# Patient Record
Sex: Female | Born: 1950 | Race: White | Hispanic: No | Smoking: Former smoker
Health system: Southern US, Community
[De-identification: ages and names within clinical notes are randomized; demographics above are authoritative.]

## PROBLEM LIST (undated history)

## (undated) DIAGNOSIS — Z923 Personal history of irradiation: Secondary | ICD-10-CM

## (undated) DIAGNOSIS — C801 Malignant (primary) neoplasm, unspecified: Secondary | ICD-10-CM

## (undated) DIAGNOSIS — M549 Dorsalgia, unspecified: Secondary | ICD-10-CM

## (undated) DIAGNOSIS — M419 Scoliosis, unspecified: Secondary | ICD-10-CM

## (undated) DIAGNOSIS — I10 Essential (primary) hypertension: Secondary | ICD-10-CM

## (undated) DIAGNOSIS — R0981 Nasal congestion: Secondary | ICD-10-CM

## (undated) HISTORY — PX: BACK SURGERY: SHX140

## (undated) HISTORY — DX: Essential (primary) hypertension: I10

## (undated) HISTORY — DX: Dorsalgia, unspecified: M54.9

## (undated) HISTORY — DX: Scoliosis, unspecified: M41.9

## (undated) HISTORY — DX: Nasal congestion: R09.81

## (undated) HISTORY — PX: ABDOMINAL HYSTERECTOMY: SHX81

---

## 2021-02-16 ENCOUNTER — Other Ambulatory Visit: Payer: Self-pay | Admitting: Neurosurgery

## 2021-02-16 DIAGNOSIS — R918 Other nonspecific abnormal finding of lung field: Secondary | ICD-10-CM

## 2021-03-05 ENCOUNTER — Ambulatory Visit
Admission: RE | Admit: 2021-03-05 | Discharge: 2021-03-05 | Disposition: A | Discharge status: Home / Self Care | Payer: Medicare Other | Source: Ambulatory Visit | Attending: Neurosurgery | Admitting: Neurosurgery

## 2021-03-05 ENCOUNTER — Other Ambulatory Visit: Payer: Self-pay

## 2021-03-05 DIAGNOSIS — R918 Other nonspecific abnormal finding of lung field: Secondary | ICD-10-CM

## 2021-03-05 MED ORDER — IOPAMIDOL (ISOVUE-370) INJECTION 76%
80.0000 mL | Freq: Once | INTRAVENOUS | Status: AC | PRN
  Administered 2021-03-05: 80 mL via INTRAVENOUS

## 2021-04-02 ENCOUNTER — Other Ambulatory Visit: Payer: Self-pay

## 2021-04-02 ENCOUNTER — Ambulatory Visit (INDEPENDENT_AMBULATORY_CARE_PROVIDER_SITE_OTHER): Payer: Medicare Other | Admitting: Pulmonary Disease

## 2021-04-02 ENCOUNTER — Encounter: Payer: Self-pay | Admitting: Pulmonary Disease

## 2021-04-02 VITALS — BP 126/80 | HR 68 | Temp 98.4°F | Ht 69.0 in | Wt 97.0 lb

## 2021-04-02 DIAGNOSIS — R911 Solitary pulmonary nodule: Secondary | ICD-10-CM

## 2021-04-02 DIAGNOSIS — Z87891 Personal history of nicotine dependence: Secondary | ICD-10-CM

## 2021-04-02 DIAGNOSIS — R918 Other nonspecific abnormal finding of lung field: Secondary | ICD-10-CM | POA: Diagnosis not present

## 2021-04-02 DIAGNOSIS — T17500A Unspecified foreign body in bronchus causing asphyxiation, initial encounter: Secondary | ICD-10-CM | POA: Diagnosis not present

## 2021-04-02 NOTE — Progress Notes (Signed)
Synopsis: Referred in November 2022 for lung nodule by Robin Levine, MD  Subjective:   PATIENT ID: Robin Bryant GENDER: female DOB: 05-24-1950, MRN: [id number]  Chief Complaint  Patient presents with   Consult    Patient wants to talk about the scan she had done    This is a 70 year old female, past medical history hypertension, scoliosis, referred for evaluation of pulmonary nodules.  Patient had a CT of the thoracic spine that was completed by Dr. Vertell Limber from neurosurgery.  Imaging revealed mild bronchiectasis with areas of mucoid impaction within the lung and predominantly within the right middle lobe suggestive of NTM or MAC.  Also found to have bilateral pulmonary nodules within the lung.  Largest within the medial portion of the right lower lobe at 1.8 x 2.3 cm in size.  Patient was referred for evaluation.  Imaging complete and reviewed today in the office.   Past Medical History:  Diagnosis Date   Back pain    HTN (hypertension)    Scoliosis    Sinus congestion      Family History  Problem Relation Age of Onset   Alzheimer's disease Mother    Cancer Father    Lung cancer Father      Past Surgical History:  Procedure Laterality Date   ABDOMINAL HYSTERECTOMY     BACK SURGERY      Social History   Socioeconomic History   Marital status: Single    Spouse name: Not on file   Number of children: Not on file   Years of education: Not on file   Highest education level: Not on file  Occupational History   Not on file  Tobacco Use   Smoking status: Every Day    Packs/day: 0.50    Years: 50.00    Pack years: 25.00    Types: Cigarettes   Smokeless tobacco: Never  Substance and Sexual Activity   Alcohol use: Never   Drug use: Never   Sexual activity: Not on file  Other Topics Concern   Not on file  Social History Narrative   Not on file   Social Determinants of Health   Financial Resource Strain: Not on file  Food Insecurity: Not on file   Transportation Needs: Not on file  Physical Activity: Not on file  Stress: Not on file  Social Connections: Not on file  Intimate Partner Violence: Not on file     Not on File   Outpatient Medications Prior to Visit  Medication Sig Dispense Refill   EUTHYROX 50 MCG tablet Take 50 mcg by mouth every morning.     gabapentin (NEURONTIN) 800 MG tablet Take 800 mg by mouth 4 (four) times daily.     HYDROcodone-acetaminophen (NORCO) 10-325 MG tablet Take 1 tablet by mouth 4 (four) times daily as needed.     metoprolol succinate (TOPROL-XL) 25 MG 24 hr tablet Take 25 mg by mouth daily.     No facility-administered medications prior to visit.    Review of Systems  Constitutional:  Negative for chills, fever, malaise/fatigue and weight loss.  HENT:  Negative for hearing loss, sore throat and tinnitus.   Eyes:  Negative for blurred vision and double vision.  Respiratory:  Negative for cough, hemoptysis, sputum production, shortness of breath, wheezing and stridor.   Cardiovascular:  Negative for chest pain, palpitations, orthopnea, leg swelling and PND.  Gastrointestinal:  Negative for abdominal pain, constipation, diarrhea, heartburn, nausea and vomiting.  Genitourinary:  Negative for dysuria, hematuria  and urgency.  Musculoskeletal:  Negative for joint pain and myalgias.  Skin:  Negative for itching and rash.  Neurological:  Negative for dizziness, tingling, weakness and headaches.  Endo/Heme/Allergies:  Negative for environmental allergies. Does not bruise/bleed easily.  Psychiatric/Behavioral:  Negative for depression. The patient is not nervous/anxious and does not have insomnia.   All other systems reviewed and are negative.   Objective:  Physical Exam Vitals reviewed.  Constitutional:      General: She is not in acute distress.    Appearance: She is well-developed.  HENT:     Head: Normocephalic and atraumatic.  Eyes:     General: No scleral icterus.    Conjunctiva/sclera:  Conjunctivae normal.     Pupils: Pupils are equal, round, and reactive to light.  Neck:     Vascular: No JVD.     Trachea: No tracheal deviation.  Cardiovascular:     Rate and Rhythm: Normal rate and regular rhythm.     Heart sounds: Normal heart sounds. No murmur heard. Pulmonary:     Effort: Pulmonary effort is normal. No tachypnea, accessory muscle usage or respiratory distress.     Breath sounds: Normal breath sounds. No stridor. No wheezing, rhonchi or rales.  Abdominal:     General: There is no distension.     Palpations: Abdomen is soft.     Tenderness: There is no abdominal tenderness.  Musculoskeletal:        General: No tenderness.     Cervical back: Neck supple.  Lymphadenopathy:     Cervical: No cervical adenopathy.  Skin:    General: Skin is warm and dry.     Capillary Refill: Capillary refill takes less than 2 seconds.     Findings: No rash.  Neurological:     Mental Status: She is alert and oriented to person, place, and time.  Psychiatric:        Behavior: Behavior normal.     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  There is bilateral pulmonary nodules.  Largest within the right lower lobe at 1.8 x 2.3 cm in size. The patient's images have been independently reviewed by me.    Pulmonary Functions Testing Results: No flowsheet data found.  FeNO:   Pathology:   Echocardiogram:   Heart Catheterization:     Assessment & Plan:     ICD-10-CM   1.  Right lower lobe pulmonary nodule  R91.1 NM PET Image Initial (PI) Skull Base To Thigh (F-18 FDG)    2. Mucoid impaction of bronchi  T17.500A     3. Multiple pulmonary nodules  R91.8       Discussion:  This is a 70 year old female with incidentally found multiple pulmonary nodules.  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  Additionally has right lower lobe predominant nodule at 1.8 x 2.3 cm in size.  Patient  was referred for evaluation of these nodules  Plan: Before I would consider biopsy of this I think a PET scan would be Bryant appropriate. We will get the PET scan first if there is significant hypermetabolic uptake concerning for malignancy then I think we could proceed with bronchoscopy. She does have some areas of bronchiectasis which makes this lesion against the potential for being inflammatory. However with her smoking history as well as her family history of lung cancer I think it would be prudent for [place] to evaluate this closely.  If the lesion is hypermetabolic we can also discussed the potential for surgical resection of the lesion.    We will get her to see me or Eric Form, NP in a few weeks after PET scan to get bronchoscopy set up.    Current Outpatient Medications:    EUTHYROX 50 MCG tablet, Take 50 mcg by mouth every morning., Disp: , Rfl:    gabapentin (NEURONTIN) 800 MG tablet, Take 800 mg by mouth 4 (four) times daily., Disp: , Rfl:    HYDROcodone-acetaminophen (NORCO) 10-325 MG tablet, Take 1 tablet by mouth 4 (four) times daily as needed., Disp: , Rfl:    metoprolol succinate (TOPROL-XL) 25 MG 24 hr tablet, Take 25 mg by mouth daily., Disp: , Rfl:   I spent 63 minutes dedicated to the care of this patient on the date of this encounter to include pre-visit review of records, face-to-face time with the patient discussing conditions above, post visit ordering of testing, clinical documentation with the  electronic health record, making appropriate referrals as documented, and communicating necessary findings to members of the patients care team.   Garner Nash, DO [facility] 04/02/2021 4:13 PM

## 2021-04-02 NOTE — Patient Instructions (Signed)
Thank you for visiting Dr. Valeta Harms at [facility]. Today we recommend the following:  Orders Placed This Encounter  Procedures   NM PET Image Initial (PI) Skull Base To Thigh (F-18 FDG)   PET SCAN First  We will discuss results and if positive get you set up for bronchoscopy   Return in about 3 weeks (around 04/23/2021) for with Eric Form, NP, or Dr. Valeta Harms.    Please do your part to reduce the spread of COVID-19.

## 2021-04-05 ENCOUNTER — Other Ambulatory Visit (HOSPITAL_COMMUNITY): Payer: Medicare Other

## 2021-04-16 ENCOUNTER — Other Ambulatory Visit: Payer: Self-pay

## 2021-04-16 ENCOUNTER — Ambulatory Visit (HOSPITAL_COMMUNITY)
Admission: RE | Admit: 2021-04-16 | Discharge: 2021-04-16 | Disposition: A | Discharge status: Home / Self Care | Payer: Medicare Other | Source: Ambulatory Visit | Attending: Pulmonary Disease | Admitting: Pulmonary Disease

## 2021-04-16 DIAGNOSIS — J439 Emphysema, unspecified: Secondary | ICD-10-CM | POA: Insufficient documentation

## 2021-04-16 DIAGNOSIS — I2510 Atherosclerotic heart disease of native coronary artery without angina pectoris: Secondary | ICD-10-CM | POA: Insufficient documentation

## 2021-04-16 DIAGNOSIS — R911 Solitary pulmonary nodule: Secondary | ICD-10-CM | POA: Insufficient documentation

## 2021-04-16 DIAGNOSIS — I700 Atherosclerosis of aorta: Secondary | ICD-10-CM | POA: Insufficient documentation

## 2021-04-16 LAB — GLUCOSE, CAPILLARY: Glucose-Capillary: 107 mg/dL — ABNORMAL HIGH (ref 70–99)

## 2021-04-16 MED ORDER — FLUDEOXYGLUCOSE F - 18 (FDG) INJECTION
5.0000 | Freq: Once | INTRAVENOUS | Status: AC | PRN
  Administered 2021-04-16: 12:00:00 5 via INTRAVENOUS

## 2021-04-23 ENCOUNTER — Encounter: Payer: Self-pay | Admitting: Pulmonary Disease

## 2021-04-23 ENCOUNTER — Ambulatory Visit (INDEPENDENT_AMBULATORY_CARE_PROVIDER_SITE_OTHER): Payer: Medicare Other | Admitting: Pulmonary Disease

## 2021-04-23 ENCOUNTER — Other Ambulatory Visit: Payer: Self-pay

## 2021-04-23 VITALS — BP 136/100 | HR 81 | Temp 98.2°F | Ht 69.0 in | Wt 98.4 lb

## 2021-04-23 DIAGNOSIS — Z87891 Personal history of nicotine dependence: Secondary | ICD-10-CM | POA: Diagnosis not present

## 2021-04-23 DIAGNOSIS — R911 Solitary pulmonary nodule: Secondary | ICD-10-CM | POA: Diagnosis not present

## 2021-04-23 DIAGNOSIS — R942 Abnormal results of pulmonary function studies: Secondary | ICD-10-CM

## 2021-04-23 DIAGNOSIS — R918 Other nonspecific abnormal finding of lung field: Secondary | ICD-10-CM

## 2021-04-23 NOTE — Progress Notes (Signed)
Synopsis: Referred in November 2022 for lung nodule by No ref. provider found  Subjective:   PATIENT ID: Robin Bryant GENDER: female DOB: 1951-05-18, MRN: [id number]  Chief Complaint  Patient presents with   Follow-up    Patient is here to talk about results from PET scan    This is a 70 year old female, past medical history hypertension, scoliosis, referred for evaluation of pulmonary nodules.  Patient had a CT of the thoracic spine that was completed by Dr. Vertell Limber from neurosurgery.  Imaging revealed mild bronchiectasis with areas of mucoid impaction within the lung and predominantly within the right middle lobe suggestive of NTM or MAC.  Also found to have bilateral pulmonary nodules within the lung.  Largest within the medial portion of the right lower lobe at 1.8 x 2.3 cm in size.  Patient was referred for evaluation.  Imaging complete and reviewed today in the office.  OV 04/23/2021: Patient here today for follow-up after nuclear medicine pet imaging.Patient had nuclear medicine PET imaging on 04/16/2021.  This revealed the same right lower lobe nodule with measurement of 1.8 x 1.6 cm SUV max of 9.  Additionally the left upper lobe nodules scattered throughout the lung have hypermetabolic uptake.  Concern for either infectious inflammatory disease within the right upper lobe and potentially a primary bronchogenic carcinoma within the right lower lobe.  I reviewed the images today with the patient in the office as well as her friend present.  We discussed all of next possible steps.   Past Medical History:  Diagnosis Date   Back pain    HTN (hypertension)    Scoliosis    Sinus congestion      Family History  Problem Relation Age of Onset   Alzheimer's disease Mother    Cancer Father    Lung cancer Father      Past Surgical History:  Procedure Laterality Date   ABDOMINAL HYSTERECTOMY     BACK SURGERY      Social History   Socioeconomic History   Marital status: Divorced     Spouse name: Not on file   Number of children: Not on file   Years of education: Not on file   Highest education level: Not on file  Occupational History   Not on file  Tobacco Use   Smoking status: Every Day    Packs/day: 0.50    Years: 50.00    Pack years: 25.00    Types: Cigarettes   Smokeless tobacco: Never  Substance and Sexual Activity   Alcohol use: Never   Drug use: Never   Sexual activity: Not on file  Other Topics Concern   Not on file  Social History Narrative   Not on file   Social Determinants of Health   Financial Resource Strain: Not on file  Food Insecurity: Not on file  Transportation Needs: Not on file  Physical Activity: Not on file  Stress: Not on file  Social Connections: Not on file  Intimate Partner Violence: Not on file     Not on File   Outpatient Medications Prior to Visit  Medication Sig Dispense Refill   EUTHYROX 50 MCG tablet Take 50 mcg by mouth every morning.     gabapentin (NEURONTIN) 800 MG tablet Take 800 mg by mouth 4 (four) times daily.     HYDROcodone-acetaminophen (NORCO) 10-325 MG tablet Take 1 tablet by mouth 4 (four) times daily as needed.     metoprolol succinate (TOPROL-XL) 25 MG 24 hr tablet  Take 25 mg by mouth daily.     No facility-administered medications prior to visit.    Review of Systems  Constitutional:  Negative for chills, fever, malaise/fatigue and weight loss.  HENT:  Negative for hearing loss, sore throat and tinnitus.   Eyes:  Negative for blurred vision and double vision.  Respiratory:  Negative for cough, hemoptysis, sputum production, shortness of breath, wheezing and stridor.   Cardiovascular:  Negative for chest pain, palpitations, orthopnea, leg swelling and PND.  Gastrointestinal:  Negative for abdominal pain, constipation, diarrhea, heartburn, nausea and vomiting.  Genitourinary:  Negative for dysuria, hematuria and urgency.  Musculoskeletal:  Negative for joint pain and myalgias.  Skin:   Negative for itching and rash.  Neurological:  Negative for dizziness, tingling, weakness and headaches.  Endo/Heme/Allergies:  Negative for environmental allergies. Does not bruise/bleed easily.  Psychiatric/Behavioral:  Negative for depression. The patient is not nervous/anxious and does not have insomnia.   All other systems reviewed and are negative.   Objective:  Physical Exam Vitals reviewed.  Constitutional:      General: She is not in acute distress.    Appearance: She is well-developed.     Comments: Thin frail elderly lady.  HENT:     Head: Normocephalic and atraumatic.  Eyes:     General: No scleral icterus.    Conjunctiva/sclera: Conjunctivae normal.     Pupils: Pupils are equal, round, and reactive to light.  Neck:     Vascular: No JVD.     Trachea: No tracheal deviation.  Cardiovascular:     Rate and Rhythm: Normal rate and regular rhythm.     Heart sounds: Normal heart sounds. No murmur heard. Pulmonary:     Effort: Pulmonary effort is normal. No tachypnea, accessory muscle usage or respiratory distress.     Breath sounds: No stridor. No wheezing, rhonchi or rales.  Abdominal:     General: There is no distension.     Palpations: Abdomen is soft.     Tenderness: There is no abdominal tenderness.  Musculoskeletal:        General: Deformity present. No tenderness.     Cervical back: Neck supple.     Comments: Kyphosis  Lymphadenopathy:     Cervical: No cervical adenopathy.  Skin:    General: Skin is warm and dry.     Capillary Refill: Capillary refill takes less than 2 seconds.     Findings: No rash.  Neurological:     Mental Status: She is alert and oriented to person, place, and time.  Psychiatric:        Behavior: Behavior normal.     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  There is bilateral pulmonary nodules.  Largest within the right lower lobe at 1.8 x 2.3 cm in size. The patient's images have  been independently reviewed by me.    04/16/2021 nuclear medicine pet imaging: 1.8 cm right lower lobe pleural-based pulmonary nodule hypermetabolic with SUV of 9 concerning for primary bronchogenic carcinoma. The patient's images have been independently reviewed by me.    Pulmonary Functions Testing Results: No flowsheet data found.  FeNO:   Pathology:   Echocardiogram:   Heart Catheterization:     Assessment & Plan:     ICD-10-CM   1. Multiple lung nodules  R91.8 Ambulatory referral to Pulmonology    Procedural/ Surgical Case Request: ROBOTIC ASSISTED NAVIGATIONAL BRONCHOSCOPY    CT Super D Chest Wo Contrast    2. Right lower lobe pulmonary nodule  R91.1     3. Multiple pulmonary nodules  R91.8     4. Former smoker  Z87.891     5. Abnormal PET of right lung  R94.2       Discussion:  This is a 70 year old female, incidentally found multiple pulmonary nodules, the largest being 1.8 cm within the right lower lobe hypermetabolic in nature on pet imaging concerning for primary bronchogenic carcinoma.  She also has disease within the right middle lobe and left upper lobe associated bronchiectasis and small nodules potential for an atypical infection such as NTM.  Plan: Due to the patient's abnormalities we discussed all of the possible neck steps and reviewed images today in the office in detail.  Patient is agreeable to proceed with robotic assisted  navigational bronchoscopy with tissue sampling. We will also plan for cultures of the tissue due to the potential for NTM. Patient is agreeable to proceed with this. We have scheduled a tentative date for 05/15/2021. Orders have been placed. We appreciate PCC's help with scheduling.    Current Outpatient Medications:    EUTHYROX 50 MCG tablet, Take 50 mcg by mouth every morning., Disp: , Rfl:    gabapentin (NEURONTIN) 800 MG tablet, Take 800 mg by mouth 4 (four) times daily., Disp: , Rfl:    HYDROcodone-acetaminophen (NORCO) 10-325 MG tablet, Take 1 tablet by mouth 4 (four) times daily as needed., Disp: , Rfl:    metoprolol succinate (TOPROL-XL) 25 MG 24 hr tablet, Take 25 mg by mouth daily., Disp: , Rfl:     Garner Nash, DO [facility] 04/23/2021 11:11 AM

## 2021-04-23 NOTE — Patient Instructions (Addendum)
Thank you for visiting Dr. Valeta Harms at [facility]. Today we recommend the following:  Orders Placed This Encounter  Procedures   Procedural/ Surgical Case Request: ROBOTIC ASSISTED NAVIGATIONAL BRONCHOSCOPY   CT Super D Chest Wo Contrast   Ambulatory referral to Pulmonology   Tentative bronchoscopy date 05/15/2021  Return in about 4 weeks (around 05/21/2021) for with Eric Form, NP, or Dr. Valeta Harms. To review bronchoscopy results.     Please do your part to reduce the spread of COVID-19.

## 2021-04-23 NOTE — H&P (View-Only) (Signed)
Synopsis: Referred in November 2022 for lung nodule by No ref. provider found  Subjective:   PATIENT ID: Robin Bryant GENDER: female DOB: [birth year], MRN: [id number]  Chief Complaint  Patient presents with   Follow-up    Patient is here to talk about results from PET scan    This is a 70 year old female, past medical history hypertension, scoliosis, referred for evaluation of pulmonary nodules.  Patient had a CT of the thoracic spine that was completed by Dr. Vertell Limber from neurosurgery.  Imaging revealed mild bronchiectasis with areas of mucoid impaction within the lung and predominantly within the right middle lobe suggestive of NTM or MAC.  Also found to have bilateral pulmonary nodules within the lung.  Largest within the medial portion of the right lower lobe at 1.8 x 2.3 cm in size.  Patient was referred for evaluation.  Imaging complete and reviewed today in the office.  OV 04/23/2021: Patient here today for follow-up after nuclear medicine pet imaging.Patient had nuclear medicine PET imaging on 04/16/2021.  This revealed the same right lower lobe nodule with measurement of 1.8 x 1.6 cm SUV max of 9.  Additionally the left upper lobe nodules scattered throughout the lung have hypermetabolic uptake.  Concern for either infectious inflammatory disease within the right upper lobe and potentially a primary bronchogenic carcinoma within the right lower lobe.  I reviewed the images today with the patient in the office as well as her friend present.  We discussed all of next possible steps.   Past Medical History:  Diagnosis Date   Back pain    HTN (hypertension)    Scoliosis    Sinus congestion      Family History  Problem Relation Age of Onset   Alzheimer's disease Mother    Cancer Father    Lung cancer Father      Past Surgical History:  Procedure Laterality Date   ABDOMINAL HYSTERECTOMY     BACK SURGERY      Social History   Socioeconomic History   Marital status: Divorced     Spouse name: Not on file   Number of children: Not on file   Years of education: Not on file   Highest education level: Not on file  Occupational History   Not on file  Tobacco Use   Smoking status: Every Day    Packs/day: 0.50    Years: 50.00    Pack years: 25.00    Types: Cigarettes   Smokeless tobacco: Never  Substance and Sexual Activity   Alcohol use: Never   Drug use: Never   Sexual activity: Not on file  Other Topics Concern   Not on file  Social History Narrative   Not on file   Social Determinants of Health   Financial Resource Strain: Not on file  Food Insecurity: Not on file  Transportation Needs: Not on file  Physical Activity: Not on file  Stress: Not on file  Social Connections: Not on file  Intimate Partner Violence: Not on file     Not on File   Outpatient Medications Prior to Visit  Medication Sig Dispense Refill   EUTHYROX 50 MCG tablet Take 50 mcg by mouth every morning.     gabapentin (NEURONTIN) 800 MG tablet Take 800 mg by mouth 4 (four) times daily.     HYDROcodone-acetaminophen (NORCO) 10-325 MG tablet Take 1 tablet by mouth 4 (four) times daily as needed.     metoprolol succinate (TOPROL-XL) 25 MG 24 hr tablet  Take 25 mg by mouth daily.     No facility-administered medications prior to visit.    Review of Systems  Constitutional:  Negative for chills, fever, malaise/fatigue and weight loss.  HENT:  Negative for hearing loss, sore throat and tinnitus.   Eyes:  Negative for blurred vision and double vision.  Respiratory:  Negative for cough, hemoptysis, sputum production, shortness of breath, wheezing and stridor.   Cardiovascular:  Negative for chest pain, palpitations, orthopnea, leg swelling and PND.  Gastrointestinal:  Negative for abdominal pain, constipation, diarrhea, heartburn, nausea and vomiting.  Genitourinary:  Negative for dysuria, hematuria and urgency.  Musculoskeletal:  Negative for joint pain and myalgias.  Skin:   Negative for itching and rash.  Neurological:  Negative for dizziness, tingling, weakness and headaches.  Endo/Heme/Allergies:  Negative for environmental allergies. Does not bruise/bleed easily.  Psychiatric/Behavioral:  Negative for depression. The patient is not nervous/anxious and does not have insomnia.   All other systems reviewed and are negative.   Objective:  Physical Exam Vitals reviewed.  Constitutional:      General: She is not in acute distress.    Appearance: She is well-developed.     Comments: Thin frail elderly lady.  HENT:     Head: Normocephalic and atraumatic.  Eyes:     General: No scleral icterus.    Conjunctiva/sclera: Conjunctivae normal.     Pupils: Pupils are equal, round, and reactive to light.  Neck:     Vascular: No JVD.     Trachea: No tracheal deviation.  Cardiovascular:     Rate and Rhythm: Normal rate and regular rhythm.     Heart sounds: Normal heart sounds. No murmur heard. Pulmonary:     Effort: Pulmonary effort is normal. No tachypnea, accessory muscle usage or respiratory distress.     Breath sounds: No stridor. No wheezing, rhonchi or rales.  Abdominal:     General: There is no distension.     Palpations: Abdomen is soft.     Tenderness: There is no abdominal tenderness.  Musculoskeletal:        General: Deformity present. No tenderness.     Cervical back: Neck supple.     Comments: Kyphosis  Lymphadenopathy:     Cervical: No cervical adenopathy.  Skin:    General: Skin is warm and dry.     Capillary Refill: Capillary refill takes less than 2 seconds.     Findings: No rash.  Neurological:     Mental Status: She is alert and oriented to person, place, and time.  Psychiatric:        Behavior: Behavior normal.     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  There is bilateral pulmonary nodules.  Largest within the right lower lobe at 1.8 x 2.3 cm in size. The patient's images have   been independently reviewed by me.    04/16/2021 nuclear medicine pet imaging: 1.8 cm right lower lobe pleural-based pulmonary nodule hypermetabolic with SUV of 9 concerning for primary bronchogenic carcinoma. The patient's images have been independently reviewed by me.    Pulmonary Functions Testing Results: No flowsheet data found.  FeNO:   Pathology:   Echocardiogram:   Heart Catheterization:     Assessment & Plan:     ICD-10-CM   1. Multiple lung nodules  R91.8 Ambulatory referral to Pulmonology    Procedural/ Surgical Case Request: ROBOTIC ASSISTED NAVIGATIONAL BRONCHOSCOPY    CT Super D Chest Wo Contrast    2. Right lower lobe pulmonary nodule  R91.1     3. Multiple pulmonary nodules  R91.8     4. Former smoker  Z87.891     5. Abnormal PET of right lung  R94.2       Discussion:  This is a 70 year old female, incidentally found multiple pulmonary nodules, the largest being 1.8 cm within the right lower lobe hypermetabolic in nature on pet imaging concerning for primary bronchogenic carcinoma.  She also has disease within the right middle lobe and left upper lobe associated bronchiectasis and small nodules potential for an atypical infection such as NTM.  Plan: Due to the patient's abnormalities we discussed all of the possible neck steps and reviewed images today in the office in detail.  Patient is agreeable to proceed with robotic assisted  navigational bronchoscopy with tissue sampling. We will also plan for cultures of the tissue due to the potential for NTM. Patient is agreeable to proceed with this. We have scheduled a tentative date for 05/15/2021. Orders have been placed. We appreciate PCC's help with scheduling.    Current Outpatient Medications:    EUTHYROX 50 MCG tablet, Take 50 mcg by mouth every morning., Disp: , Rfl:    gabapentin (NEURONTIN) 800 MG tablet, Take 800 mg by mouth 4 (four) times daily., Disp: , Rfl:    HYDROcodone-acetaminophen (NORCO) 10-325 MG tablet, Take 1 tablet by mouth 4 (four) times daily as needed., Disp: , Rfl:    metoprolol succinate (TOPROL-XL) 25 MG 24 hr tablet, Take 25 mg by mouth daily., Disp: , Rfl:     Garner Nash, DO [facility] 04/23/2021 11:11 AM

## 2021-05-10 ENCOUNTER — Other Ambulatory Visit: Payer: Self-pay

## 2021-05-10 ENCOUNTER — Encounter (HOSPITAL_COMMUNITY): Payer: Self-pay | Admitting: Pulmonary Disease

## 2021-05-10 NOTE — Progress Notes (Signed)
PCP - denies Cardiologist - denies EKG - DOS Chest x-ray -  ECHO -  Cardiac Cath -  CPAP -   Aspirin Instructions: Follow your surgeon's instructions on when to stop Aspirin.  If no instructions were given by your surgeon then you will need to call the office to get those instructions.    ERAS Protcol -  COVID TEST- Friday 12/23  Anesthesia review: n/a  -------------  SDW INSTRUCTIONS:  Your procedure is scheduled on Tuesday 12/27. Please report to [facility] Main Entrance "A" at 0700 A.M., and check in at the Admitting office. Call this number if you have problems the morning of surgery: [phone]   Remember: Do not eat or drink after midnight the night before your surgery   Medications to take morning of surgery with a sip of water include: EUTHYROX  gabapentin (NEURONTIN) metoprolol succinate (TOPROL-XL)  HYDROcodone-acetaminophen (NORCO)  As of today, STOP taking any Aspirin (unless otherwise instructed by your surgeon), Aleve, Naproxen, Ibuprofen, Motrin, Advil, Goody's, BC's, all herbal medications, fish oil, and all vitamins.    The Morning of Surgery Do not wear jewelry, make-up or nail polish. Do not wear lotions, powders, or perfumes, or deodorant Do not bring valuables to the hospital. [facility] is not responsible for any belongings or valuables.  If you are a smoker, DO NOT Smoke 24 hours prior to surgery  If you wear a CPAP at night please bring your mask the morning of surgery   Remember that you must have someone to transport you home after your surgery, and remain with you for 24 hours if you are discharged the same day.  Please bring cases for contacts, glasses, hearing aids, dentures or bridgework because it cannot be worn into surgery.   Patients discharged the day of surgery will not be allowed to drive home.   Please shower the NIGHT BEFORE/MORNING OF SURGERY (use antibacterial soap like DIAL soap if possible). Wear comfortable clothes the  morning of surgery. Oral Hygiene is also important to reduce your risk of infection.  Remember - BRUSH YOUR TEETH THE MORNING OF SURGERY WITH YOUR REGULAR TOOTHPASTE  Patient denies shortness of breath, fever, cough and chest pain.

## 2021-05-11 ENCOUNTER — Other Ambulatory Visit: Payer: Self-pay | Admitting: Pulmonary Disease

## 2021-05-11 ENCOUNTER — Ambulatory Visit (HOSPITAL_COMMUNITY)
Admission: RE | Admit: 2021-05-11 | Discharge: 2021-05-11 | Disposition: A | Discharge status: Home / Self Care | Payer: Medicare Other | Source: Ambulatory Visit | Attending: Pulmonary Disease | Admitting: Pulmonary Disease

## 2021-05-11 DIAGNOSIS — R918 Other nonspecific abnormal finding of lung field: Secondary | ICD-10-CM | POA: Insufficient documentation

## 2021-05-11 LAB — SARS CORONAVIRUS 2 (TAT 6-24 HRS): SARS Coronavirus 2: NEGATIVE

## 2021-05-15 ENCOUNTER — Encounter (HOSPITAL_COMMUNITY): Payer: Self-pay | Admitting: Pulmonary Disease

## 2021-05-15 ENCOUNTER — Encounter (HOSPITAL_COMMUNITY)
Admission: RE | Disposition: A | Discharge status: Home / Self Care | Payer: Self-pay | Source: Home / Self Care | Attending: Pulmonary Disease

## 2021-05-15 ENCOUNTER — Ambulatory Visit (HOSPITAL_COMMUNITY)
Admission: RE | Admit: 2021-05-15 | Discharge: 2021-05-15 | Disposition: A | Discharge status: Home / Self Care | Payer: Medicare Other | Attending: Pulmonary Disease | Admitting: Pulmonary Disease

## 2021-05-15 ENCOUNTER — Ambulatory Visit (HOSPITAL_COMMUNITY): Payer: Medicare Other

## 2021-05-15 ENCOUNTER — Ambulatory Visit (HOSPITAL_COMMUNITY): Payer: Medicare Other | Admitting: Anesthesiology

## 2021-05-15 DIAGNOSIS — J984 Other disorders of lung: Secondary | ICD-10-CM | POA: Insufficient documentation

## 2021-05-15 DIAGNOSIS — Z9889 Other specified postprocedural states: Secondary | ICD-10-CM

## 2021-05-15 DIAGNOSIS — Z87891 Personal history of nicotine dependence: Secondary | ICD-10-CM | POA: Insufficient documentation

## 2021-05-15 DIAGNOSIS — I10 Essential (primary) hypertension: Secondary | ICD-10-CM | POA: Insufficient documentation

## 2021-05-15 DIAGNOSIS — R846 Abnormal cytological findings in specimens from respiratory organs and thorax: Secondary | ICD-10-CM | POA: Insufficient documentation

## 2021-05-15 DIAGNOSIS — Z419 Encounter for procedure for purposes other than remedying health state, unspecified: Secondary | ICD-10-CM

## 2021-05-15 DIAGNOSIS — M419 Scoliosis, unspecified: Secondary | ICD-10-CM | POA: Insufficient documentation

## 2021-05-15 DIAGNOSIS — I700 Atherosclerosis of aorta: Secondary | ICD-10-CM | POA: Diagnosis not present

## 2021-05-15 DIAGNOSIS — E039 Hypothyroidism, unspecified: Secondary | ICD-10-CM | POA: Diagnosis not present

## 2021-05-15 DIAGNOSIS — R011 Cardiac murmur, unspecified: Secondary | ICD-10-CM | POA: Diagnosis not present

## 2021-05-15 DIAGNOSIS — R918 Other nonspecific abnormal finding of lung field: Secondary | ICD-10-CM

## 2021-05-15 DIAGNOSIS — J479 Bronchiectasis, uncomplicated: Secondary | ICD-10-CM | POA: Insufficient documentation

## 2021-05-15 HISTORY — PX: BRONCHIAL BIOPSY: SHX5109

## 2021-05-15 HISTORY — PX: VIDEO BRONCHOSCOPY WITH RADIAL ENDOBRONCHIAL ULTRASOUND: SHX6849

## 2021-05-15 HISTORY — PX: BRONCHIAL BRUSHINGS: SHX5108

## 2021-05-15 HISTORY — PX: BRONCHIAL NEEDLE ASPIRATION BIOPSY: SHX5106

## 2021-05-15 HISTORY — PX: BRONCHIAL WASHINGS: SHX5105

## 2021-05-15 LAB — BASIC METABOLIC PANEL
Anion gap: 7 (ref 5–15)
BUN: 15 mg/dL (ref 8–23)
CO2: 29 mmol/L (ref 22–32)
Calcium: 8.8 mg/dL — ABNORMAL LOW (ref 8.9–10.3)
Chloride: 107 mmol/L (ref 98–111)
Creatinine, Ser: 0.69 mg/dL (ref 0.44–1.00)
GFR, Estimated: >60 mL/min (ref >60)
Glucose, Bld: 84 mg/dL (ref 70–99)
Potassium: 3.5 mmol/L (ref 3.5–5.1)
Sodium: 143 mmol/L (ref 135–145)

## 2021-05-15 LAB — CBC
HCT: 42.4 % (ref 36.0–46.0)
Hemoglobin: 13.9 g/dL (ref 12.0–15.0)
MCH: 32.9 pg (ref 26.0–34.0)
MCHC: 32.8 g/dL (ref 30.0–36.0)
MCV: 100.5 fL — ABNORMAL HIGH (ref 80.0–100.0)
Platelets: 215 10*3/uL (ref 150–400)
RBC: 4.22 MIL/uL (ref 3.87–5.11)
RDW: 13.7 % (ref 11.5–15.5)
WBC: 8.1 10*3/uL (ref 4.0–10.5)
nRBC: 0 % (ref 0.0–0.2)

## 2021-05-15 SURGERY — BRONCHOSCOPY, WITH BIOPSY USING ELECTROMAGNETIC NAVIGATION
Anesthesia: General | Laterality: Bilateral

## 2021-05-15 MED ORDER — ACETAMINOPHEN 500 MG PO TABS: 1000.0000 mg | ORAL_TABLET | Freq: Once | ORAL | Status: AC

## 2021-05-15 MED ORDER — LIDOCAINE 2% (20 MG/ML) 5 ML SYRINGE
INTRAMUSCULAR | Status: DC | PRN
Start: 2021-05-15 — End: 2021-05-15
  Administered 2021-05-15: 100 mg via INTRAVENOUS

## 2021-05-15 MED ORDER — CHLORHEXIDINE GLUCONATE 0.12 % MT SOLN
OROMUCOSAL | Status: AC
  Administered 2021-05-15: 07:00:00 15 mL
  Filled 2021-05-15: qty 15

## 2021-05-15 MED ORDER — GLYCOPYRROLATE PF 0.2 MG/ML IJ SOSY
PREFILLED_SYRINGE | INTRAMUSCULAR | Status: DC | PRN
  Administered 2021-05-15: .2 mg via INTRAVENOUS

## 2021-05-15 MED ORDER — FENTANYL CITRATE (PF) 100 MCG/2ML IJ SOLN: 25.0000 ug | INTRAMUSCULAR | Status: DC | PRN

## 2021-05-15 MED ORDER — ONDANSETRON HCL 4 MG/2ML IJ SOLN
INTRAMUSCULAR | Status: DC | PRN
  Administered 2021-05-15: 4 mg via INTRAVENOUS

## 2021-05-15 MED ORDER — DEXAMETHASONE SODIUM PHOSPHATE 10 MG/ML IJ SOLN
INTRAMUSCULAR | Status: DC | PRN
  Administered 2021-05-15: 10 mg via INTRAVENOUS

## 2021-05-15 MED ORDER — MIDAZOLAM HCL 2 MG/2ML IJ SOLN
INTRAMUSCULAR | Status: DC | PRN
  Administered 2021-05-15 (×2): 1 mg via INTRAVENOUS

## 2021-05-15 MED ORDER — SUGAMMADEX SODIUM 200 MG/2ML IV SOLN
INTRAVENOUS | Status: DC | PRN
  Administered 2021-05-15: 180 mg via INTRAVENOUS

## 2021-05-15 MED ORDER — ACETAMINOPHEN 500 MG PO TABS
ORAL_TABLET | ORAL | Status: AC
  Administered 2021-05-15: 07:00:00 1000 mg via ORAL
  Filled 2021-05-15: qty 2

## 2021-05-15 MED ORDER — PHENYLEPHRINE 40 MCG/ML (10ML) SYRINGE FOR IV PUSH (FOR BLOOD PRESSURE SUPPORT)
PREFILLED_SYRINGE | INTRAVENOUS | Status: DC | PRN
  Administered 2021-05-15 (×2): 80 ug via INTRAVENOUS

## 2021-05-15 MED ORDER — LACTATED RINGERS IV SOLN: INTRAVENOUS | Status: DC

## 2021-05-15 MED ORDER — PROPOFOL 10 MG/ML IV BOLUS
INTRAVENOUS | Status: DC | PRN
  Administered 2021-05-15: 150 mg via INTRAVENOUS

## 2021-05-15 MED ORDER — AMISULPRIDE (ANTIEMETIC) 5 MG/2ML IV SOLN
10.0000 mg | Freq: Once | INTRAVENOUS | Status: DC | PRN
  Filled 2021-05-15: qty 4

## 2021-05-15 MED ORDER — ROCURONIUM BROMIDE 10 MG/ML (PF) SYRINGE
PREFILLED_SYRINGE | INTRAVENOUS | Status: DC | PRN
  Administered 2021-05-15: 70 mg via INTRAVENOUS

## 2021-05-15 MED ORDER — PROMETHAZINE HCL 25 MG/ML IJ SOLN: 6.2500 mg | INTRAMUSCULAR | Status: DC | PRN

## 2021-05-15 MED ORDER — FENTANYL CITRATE (PF) 250 MCG/5ML IJ SOLN
INTRAMUSCULAR | Status: DC | PRN
  Administered 2021-05-15 (×2): 50 ug via INTRAVENOUS

## 2021-05-15 NOTE — Anesthesia Preprocedure Evaluation (Addendum)
Anesthesia Evaluation  Patient identified by MRN, date of birth, ID band Patient awake    Reviewed: Allergy & Precautions, NPO status , Patient's Chart, lab work & pertinent test results  History of Anesthesia Complications Negative for: history of anesthetic complications  Airway Mallampati: II  TM Distance: >3 FB Neck ROM: Full    Dental no notable dental hx. (+) Dental Advisory Given   Pulmonary neg pulmonary ROS, former smoker,    Pulmonary exam normal breath sounds clear to auscultation       Cardiovascular hypertension, Pt. on home beta blockers  Rhythm:Regular Rate:Normal + Systolic murmurs    Neuro/Psych negative neurological ROS     GI/Hepatic negative GI ROS, Neg liver ROS,   Endo/Other  Hypothyroidism   Renal/GU negative Renal ROS     Musculoskeletal negative musculoskeletal ROS (+)   Abdominal   Peds  Hematology negative hematology ROS (+)   Anesthesia Other Findings   Reproductive/Obstetrics                            Anesthesia Physical Anesthesia Plan  ASA: 2  Anesthesia Plan: General   Post-op Pain Management: Tylenol PO (pre-op)   Induction: Intravenous  PONV Risk Score and Plan: 3 and Ondansetron, Dexamethasone and Midazolam  Airway Management Planned: Oral ETT  Additional Equipment:   Intra-op Plan:   Post-operative Plan: Extubation in OR  Informed Consent: I have reviewed the patients History and Physical, chart, labs and discussed the procedure including the risks, benefits and alternatives for the proposed anesthesia with the patient or authorized representative who has indicated his/her understanding and acceptance.     Dental advisory given  Plan Discussed with: Anesthesiologist and CRNA  Anesthesia Plan Comments:        Anesthesia Quick Evaluation

## 2021-05-15 NOTE — Anesthesia Procedure Notes (Addendum)
Procedure Name: Intubation Date/Time: 05/15/2021 9:17 AM Performed by: Betha Loa, CRNA Pre-anesthesia Checklist: Patient identified, Emergency Drugs available, Suction available and Patient being monitored Patient Re-evaluated:Patient Re-evaluated prior to induction Oxygen Delivery Method: Circle System Utilized Preoxygenation: Pre-oxygenation with 100% oxygen Induction Type: IV induction Ventilation: Mask ventilation without difficulty Laryngoscope Size: Mac and 3 Grade View: Grade I Tube type: Oral Tube size: 8.5 mm Number of attempts: 1 Airway Equipment and Method: Stylet and Oral airway Placement Confirmation: ETT inserted through vocal cords under direct vision, positive ETCO2 and breath sounds checked- equal and bilateral Secured at: 22 cm Tube secured with: Tape Dental Injury: Teeth and Oropharynx as per pre-operative assessment

## 2021-05-15 NOTE — Interval H&P Note (Signed)
History and Physical Interval Note:  05/15/2021 8:58 AM  Robin Bryant  has presented today for surgery, with the diagnosis of multiple lung nodules.  The various methods of treatment have been discussed with the patient and family. After consideration of risks, benefits and other options for treatment, the patient has consented to  Procedure(s) with comments: ROBOTIC ASSISTED NAVIGATIONAL BRONCHOSCOPY (Bilateral) - ION w/ CIOS as a surgical intervention.  The patient's history has been reviewed, patient examined, no change in status, stable for surgery.  I have reviewed the patient's chart and labs.  Questions were answered to the patient's satisfaction.     [place]

## 2021-05-15 NOTE — Discharge Instructions (Signed)
Flexible Bronchoscopy, Care After This sheet gives you information about how to care for yourself after your test. Your doctor may also give you more specific instructions. If you have problems or questions, contact your doctor. Follow these instructions at home: Eating and drinking Do not eat or drink anything (not even water) for 2 hours after your test, or until your numbing medicine (local anesthetic) wears off. When your numbness is gone and your cough and gag reflexes have come back, you may: Eat only soft foods. Slowly drink liquids. The day after the test, go back to your normal diet. Driving Do not drive for 24 hours if you were given a medicine to help you relax (sedative). Do not drive or use heavy machinery while taking prescription pain medicine. General instructions  Take over-the-counter and prescription medicines only as told by your doctor. Return to your normal activities as told. Ask what activities are safe for you. Do not use any products that have nicotine or tobacco in them. This includes cigarettes and e-cigarettes. If you need help quitting, ask your doctor. Keep all follow-up visits as told by your doctor. This is important. It is very important if you had a tissue sample (biopsy) taken. Get help right away if: You have shortness of breath that gets worse. You get light-headed. You feel like you are going to pass out (faint). You have chest pain. You cough up: More than a little blood. More blood than before. Summary Do not eat or drink anything (not even water) for 2 hours after your test, or until your numbing medicine wears off. Do not use cigarettes. Do not use e-cigarettes. Get help right away if you have chest pain.  This information is not intended to replace advice given to you by your health care provider. Make sure you discuss any questions you have with your health care provider. Document Released: 03/03/2009 Document Revised: 04/18/2017 Document  Reviewed: 05/24/2016 Elsevier Patient Education  2020 [employer].

## 2021-05-15 NOTE — Transfer of Care (Signed)
Immediate Anesthesia Transfer of Care Note  Patient: Robin Bryant  Procedure(s) Performed: ROBOTIC ASSISTED NAVIGATIONAL BRONCHOSCOPY (Bilateral) RADIAL ENDOBRONCHIAL ULTRASOUND BRONCHIAL BIOPSIES BRONCHIAL NEEDLE ASPIRATION BIOPSIES BRONCHIAL BRUSHINGS BRONCHIAL WASHINGS  Patient Location: PACU  Anesthesia Type:General  Level of Consciousness: patient cooperative and responds to stimulation  Airway & Oxygen Therapy: Patient Spontanous Breathing  Post-op Assessment: Report given to RN and Post -op Vital signs reviewed and stable  Post vital signs: Reviewed and stable  Last Vitals:  Vitals Value Taken Time  BP    Temp 36.4 C 05/15/21 1040  Pulse    Resp    SpO2      Last Pain:  Vitals:   05/15/21 0720  TempSrc:   PainSc: 7       Patients Stated Pain Goal: 3 ([id number])  Complications: No notable events documented.

## 2021-05-15 NOTE — Anesthesia Postprocedure Evaluation (Signed)
Anesthesia Post Note  Patient: Terralyn Matsumura  Procedure(s) Performed: ROBOTIC ASSISTED NAVIGATIONAL BRONCHOSCOPY (Bilateral) RADIAL ENDOBRONCHIAL ULTRASOUND BRONCHIAL BIOPSIES BRONCHIAL NEEDLE ASPIRATION BIOPSIES BRONCHIAL BRUSHINGS BRONCHIAL WASHINGS     Patient location during evaluation: Endoscopy Anesthesia Type: General Level of consciousness: sedated Pain management: pain level controlled Vital Signs Assessment: post-procedure vital signs reviewed and stable Respiratory status: spontaneous breathing and respiratory function stable Cardiovascular status: stable Postop Assessment: no apparent nausea or vomiting Anesthetic complications: no   No notable events documented.  Last Vitals:  Vitals:   05/15/21 1125 05/15/21 1140  BP: (!) 178/96 (!) 179/82  Pulse: 79 77  Resp: 14 15  Temp:  36.6 C  SpO2: 97% 97%    Last Pain:  Vitals:   05/15/21 1140  TempSrc:   PainSc: 0-No pain                 Odell Fasching DANIEL

## 2021-05-15 NOTE — Op Note (Signed)
Video Bronchoscopy with Robotic Assisted Bronchoscopic Navigation   Date of Operation: 05/15/2021   Pre-op Diagnosis: Multiple pulmonary nodules  Post-op Diagnosis: Multiple pulmonary nodules  Surgeon: Garner Nash, DO   Assistants: None   Anesthesia: General endotracheal anesthesia  Operation: Flexible video fiberoptic bronchoscopy with robotic assistance and biopsies.  Estimated Blood Loss: Minimal  Complications: None  Indications and History: Robin Bryant is a 70 y.o. female with history of multiple pulmonary nodules. The risks, benefits, complications, treatment options and expected outcomes were discussed with the patient.  The possibilities of pneumothorax, pneumonia, reaction to medication, pulmonary aspiration, perforation of a viscus, bleeding, failure to diagnose a condition and creating a complication requiring transfusion or operation were discussed with the patient who freely signed the consent.    Description of Procedure: The patient was seen in the Preoperative Area, was examined and was deemed appropriate to proceed.  The patient was taken to [facility] endoscopy room 3, identified as Robin Bryant and the procedure verified as Flexible Video Fiberoptic Bronchoscopy.  A Time Out was held and the above information confirmed.   Prior to the date of the procedure a high-resolution CT scan of the chest was performed. Utilizing ION software program a virtual tracheobronchial tree was generated to allow the creation of distinct navigation pathways to the patient's parenchymal abnormalities. After being taken to the operating room general anesthesia was initiated and the patient  was orally intubated. The video fiberoptic bronchoscope was introduced via the endotracheal tube and a general inspection was performed which showed normal right and left lung anatomy, aspiration of the bilateral mainstems was completed to remove any remaining secretions. Robotic catheter inserted into  patient's endotracheal tube.   Target #1 right lower lobe: The distinct navigation pathways prepared prior to this procedure were then utilized to navigate to patient's lesion identified on CT scan. The robotic catheter was secured into place and the vision probe was withdrawn.  Lesion location was approximated using fluoroscopy, three-dimensional cone beam CT imaging, radial endobronchial ultrasound for peripheral targeting. Under fluoroscopic guidance transbronchial needle brushings, transbronchial needle biopsies, and transbronchial forceps biopsies were performed to be sent for cytology and pathology.   Target #2 left upper lobe anterior: The distinct navigation pathways prepared prior to this procedure were then utilized to navigate to patient's lesion identified on CT scan. The robotic catheter was secured into place and the vision probe was withdrawn.  Lesion location was approximated using fluoroscopy, three-dimensional cone beam CT imaging, and radial endobronchial ultrasound for peripheral targeting. Under fluoroscopic guidance transbronchial needle brushings, transbronchial needle biopsies, and transbronchial forceps biopsies were performed to be sent for cytology and pathology.   Target #3 Left upper lobe lateral: The distinct navigation pathways prepared prior to this procedure were then utilized to navigate to patient's lesion identified on CT scan. The robotic catheter was secured into place and the vision probe was withdrawn.  Lesion location was approximated using fluoroscopy and radial endobronchial ultrasound for peripheral targeting. Under fluoroscopic guidance transbronchial needle brushings, transbronchial needle biopsies, and transbronchial forceps biopsies were performed to be sent for cytology and pathology. A bronchioalveolar lavage was performed in the left upper lobe and sent for microbiology.  At the end of the procedure a general airway inspection was performed and there was  no evidence of active bleeding. The bronchoscope was removed.  The patient tolerated the procedure well. There was no significant blood loss and there were no obvious complications. A post-procedural chest x-ray is pending.  Samples Target #  1: 1. Transbronchial needle brushings from right Lower lobe 2. Transbronchial Wang needle biopsies from right lower lobe 3. Transbronchial forceps biopsies from right lower lobe  Samples Target #2: 1. Transbronchial needle brushings from left upper lobe anterior 2. Transbronchial Wang needle biopsies from left upper lobe anterior 3. Transbronchial forceps biopsies from left upper lobe anterior  Samples Target #3: 1. Transbronchial needle brushings from left upper lobe lateral 2. Transbronchial Wang needle biopsies from left upper lobe lateral 3. Transbronchial forceps biopsies from left upper lobe lateral 4. Bronchoalveolar lavage from left upper lobe  Plans:  The patient will be discharged from the PACU to home when recovered from anesthesia and after chest x-ray is reviewed. We will review the cytology, pathology and microbiology results with the patient when they become available. Outpatient followup will be with Leory Plowman L Katarzyna Wolven,DO.    Garner Nash, DO [facility] 05/15/2021 10:36 AM

## 2021-05-16 ENCOUNTER — Telehealth: Payer: Self-pay | Admitting: Pulmonary Disease

## 2021-05-16 ENCOUNTER — Encounter (HOSPITAL_COMMUNITY): Payer: Self-pay | Admitting: Pulmonary Disease

## 2021-05-16 DIAGNOSIS — D1431 Benign neoplasm of right bronchus and lung: Secondary | ICD-10-CM

## 2021-05-16 DIAGNOSIS — C3491 Malignant neoplasm of unspecified part of right bronchus or lung: Secondary | ICD-10-CM

## 2021-05-16 LAB — CYTOLOGY - NON PAP

## 2021-05-16 NOTE — Telephone Encounter (Signed)
Spoke with the pt  She is asking for sooner f/u with Dr Valeta Harms  She is currently scheduled for 06/11/21  She states she was told right after her procedure 05/15/21 that she has lung CA  She wants to ensure that this is what she is indeed dealing with and what next steps are  Please advise thanks

## 2021-05-17 NOTE — Telephone Encounter (Signed)
Lm x1 for patient.  

## 2021-05-17 NOTE — Telephone Encounter (Signed)
Per Eric Form, NP via epic secure chat--okay to schedule in 11:30 slot.  Spoke to patient and offered OV. She declined appt, as she prefers to see Dr. Valeta Harms.  Dr. Valeta Harms, please advise. Thanks

## 2021-05-20 LAB — CYTOLOGY - NON PAP

## 2021-05-21 LAB — AEROBIC/ANAEROBIC CULTURE W GRAM STAIN (SURGICAL/DEEP WOUND): Gram Stain: NONE SEEN

## 2021-05-22 LAB — ACID FAST SMEAR (AFB, MYCOBACTERIA)
Acid Fast Smear: NEGATIVE
Acid Fast Smear: NEGATIVE

## 2021-05-23 ENCOUNTER — Telehealth: Payer: Self-pay | Admitting: Radiation Oncology

## 2021-05-23 NOTE — Telephone Encounter (Signed)
Dr. Valeta Harms, please advise if you have any update on if the pathology is avail.

## 2021-05-23 NOTE — Telephone Encounter (Signed)
LVM to sched consult with Dr. Sondra Come

## 2021-05-23 NOTE — Telephone Encounter (Signed)
PCCM:  I called and spoke with patient regarding pathology report.   Referral placed to medical and radiation oncology   Garner Nash, DO [facility] 05/23/2021 11:09 AM

## 2021-05-24 ENCOUNTER — Encounter: Payer: Self-pay | Admitting: *Deleted

## 2021-05-24 ENCOUNTER — Other Ambulatory Visit: Payer: Self-pay | Admitting: *Deleted

## 2021-05-24 ENCOUNTER — Telehealth: Payer: Self-pay | Admitting: Radiation Oncology

## 2021-05-24 NOTE — Telephone Encounter (Signed)
Spoke with pt about appt. Pt will be calling back to confirm appt.

## 2021-05-24 NOTE — Progress Notes (Signed)
The proposed treatment discussed in conference is for discussion purpose only and is not a binding recommendation. The patient was not been physically examined, or presented with their treatment options. Therefore, final treatment plans cannot be decided.  

## 2021-05-25 ENCOUNTER — Encounter: Payer: Self-pay | Admitting: *Deleted

## 2021-05-25 NOTE — Progress Notes (Signed)
Oncology Nurse Navigator Documentation  Oncology Nurse Navigator Flowsheets 05/25/2021  Navigator Follow Up Date: 05/28/2021  Navigator Follow Up Reason: Patient Call  Navigator Location [facility]  Referral Date to RadOnc/MedOnc 05/24/2021  Navigator Encounter Type Telephone/I received referral on Ms. Hassell. I called patient to set up with medical oncology. I was unable to reach but did leave vm message to call me with my name and phone number.  Telephone Outgoing Call  Treatment Phase Pre-Tx/Tx Discussion  Barriers/Navigation Needs Coordination of Care;Education  Education Other  Interventions Coordination of Care;Education  Acuity Level 2-Minimal Needs (1-2 Barriers Identified)  Coordination of Care Other  Education Method Verbal  Time Spent with Patient 30

## 2021-05-28 ENCOUNTER — Encounter: Payer: Self-pay | Admitting: *Deleted

## 2021-05-28 NOTE — Progress Notes (Signed)
Oncology Nurse Navigator Documentation  Oncology Nurse Navigator Flowsheets 05/28/2021 05/25/2021  Navigator Follow Up Date: 05/30/2021 05/28/2021  Navigator Follow Up Reason: Patient Call Patient Call  Navigator Location [place]  Referral Date to RadOnc/MedOnc - 05/24/2021  Navigator Encounter Type Telephone/I called patient today to talk to her about scheduling with med onc. I was unable to reach but did leave vm message with my name and phone number to call.  Telephone  Telephone Outgoing Call [place] Call  Treatment Phase Pre-Tx/Tx Discussion Pre-Tx/Tx Discussion  Barriers/Navigation Needs Coordination of Care Coordination of Care;Education  Education Other Other  Interventions Coordination of Care;Education Coordination of Care;Education  Acuity Level 2-Minimal Needs (1-2 Barriers Identified) Level 2-Minimal Needs (1-2 Barriers Identified)  Coordination of Care Other Other  Education Method Verbal Verbal  Time Spent with Patient 00 71

## 2021-05-28 NOTE — Progress Notes (Signed)
Oncology Nurse Navigator Documentation  Oncology Nurse Navigator Flowsheets 05/28/2021 05/28/2021 05/25/2021  Navigator Follow Up Date: 06/18/2021 05/30/2021 05/28/2021  Navigator Follow Up Reason: New Patient Appointment Patient Call Patient Call  Navigator Location [place]  Referral Date to RadOnc/MedOnc - - 05/24/2021  Navigator Encounter Type Telephone/Robin Bryant called me back and I scheduled her to be seen with Robin Bryant. She can only make Monday appts and the next 2 Monday's she has appts. I gave her an appt on 1/30.  She verbalized understanding of appt  Telephone Telephone  Telephone Incoming Call Outgoing Call Outgoing Call  Treatment Phase Pre-Tx/Tx Discussion Pre-Tx/Tx Discussion Pre-Tx/Tx Discussion  Barriers/Navigation Needs Coordination of Care;Education Coordination of Care Coordination of Care;Education  Education Other Other Other  Interventions Coordination of Care;Psycho-Social Support;Education Coordination of Care;Education Coordination of Care;Education  Acuity Level 2-Minimal Needs (1-2 Barriers Identified) Level 2-Minimal Needs (1-2 Barriers Identified) Level 2-Minimal Needs (1-2 Barriers Identified)  Coordination of Care Appts Other Other  Education Method Verbal Verbal Verbal  Time Spent with Patient 79 [phone] 05

## 2021-05-30 NOTE — Progress Notes (Signed)
Location of tumor and Histology per Pathology Report: RLL lung nodule  Biopsy:  A. LUNG, RLL, FINE NEEDLE ASPIRATION:  - Malignant cells consistent with adenocarcinoma.   B. LUNG, RLL, BRUSHING:  - Malignant cells consistent with adenocarcinoma.  C. LUNG, LUL TARGET 2, FINE NEEDLE ASPIRATION:  - Atypical cells present   D. LUNG, LUL TARGET 2, BRUSHING:  - Atypical cells present   F. LUNG, LUL TARGET 3, BRUSHING:  - No malignant cells identified   G. LUNG, LUL TARGET 3, BIOPSY:  - Atypical cells present    Past/Anticipated interventions by surgeon, if any:   Surgeon: Garner Nash, DO  Operation: Flexible video fiberoptic bronchoscopy with robotic assistance and biopsies.  Past/Anticipated interventions by medical oncology, if any: none at this time    Pain issues, if any:  yes, 10/10 low back pain radiating to left hip constant, sharp, and throbbing  SAFETY ISSUES: Prior radiation? no Pacemaker/ICD? no Possible current pregnancy? no Is the patient on methotrexate? no  Current Complaints / other details:  neuropathy in both feet, hypertension     Vitals:   06/06/21 1348  BP: (!) 206/91  Pulse: 71  Resp: 20  Temp: 97.9 F (36.6 C)  SpO2: 98%  Weight: 99 lb (44.9 kg)  Height: 5\' 9"  (1.753 m)

## 2021-06-05 NOTE — Progress Notes (Signed)
Radiation Oncology         [phone] ________________________________  Initial Outpatient Consultation  Name: Robin Bryant MRN: [id number]  Date: 06/06/2021  DOB: Dec 07, 1950  CC:Pcp, No  Icard, Octavio Graves, DO   REFERRING PHYSICIAN: Garner Nash, DO  DIAGNOSIS: Adenocarcinoma of the RLL, clinical stage I, atypical cells on needle aspiration in the left upper lobe  HISTORY OF PRESENT ILLNESS::Robin Bryant is a 71 y.o. female who is accompanied by good friend and driver. she is seen as a courtesy of Dr. Valeta Harms for an opinion concerning radiation therapy as part of management for her recently diagnosed RLL pulmonary nodule.   The patient underwent a thoracic spine MRI on 02/14/21 ordered by neurosurgery which incidentally showed a right lung lesion. Follow-up Chest CT on 03/05/21 showed mild bronchiectasis and areas of mucoid impaction predominantly within the right middle lobe, suggestive of NTM or MAC, and bilateral pulmonary nodules measuring up to 1.8 x 2.3 cm in size in the right lower lobe. (A heterogeneous 1.6 cm left thyroid nodule was also appreciated on CT; further workup via thyroid US is recommended in regards to this finding).   Subsequently, the patient was referred to Dr. Valeta Harms on 04/02/21 for further evaluation. During which time, Dr. Valeta Harms recommended PET imaging to better assess the need for bronchoscopy and biopsies.   PET on 04/16/21 demonstrated hypermetabolism corresponding to multiple left upper lobe nodules, and a single dominant right lower lobe pulmonary nodule. Given the appearance of the lungs (bronchiectasis and areas of mucoid impaction), concern was noted for 1 or more primary bronchogenic carcinomas. Otherwise, no evidence of thoracic nodal, or extrathoracic hypermetabolic metastases were appreciated.  Super D chest CT on 05/11/21 showed the bilateral pulmonary nodules to appear stable in the interval. 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   Given findings on PET imaging, the patient agreed to proceed with navigational bronchoscopy with tissue sampling on 05/15/21 under Dr. Valeta Harms. Biopsies collected from the RLL revealed malignant cells consistent with adenocarcinoma.  LUL biopsies were also performed which revealed atypical cells present.   The patient's case was discussed at the tumor board on 05/24/21.     PREVIOUS RADIATION THERAPY: No  PAST MEDICAL HISTORY:  Past Medical History:  Diagnosis Date   Back pain    HTN (hypertension)    Scoliosis    Sinus congestion     PAST SURGICAL HISTORY: Past Surgical History:  Procedure Laterality Date   ABDOMINAL HYSTERECTOMY     BACK SURGERY     BRONCHIAL BIOPSY  05/15/2021   Procedure: BRONCHIAL BIOPSIES;  Surgeon: Garner Nash, DO;  Location: [facility];  Service: Pulmonary;;   BRONCHIAL BRUSHINGS  05/15/2021   Procedure: BRONCHIAL BRUSHINGS;  Surgeon: Garner Nash, DO;  Location: [facility];  Service: Pulmonary;;   BRONCHIAL NEEDLE ASPIRATION BIOPSY  05/15/2021   Procedure: BRONCHIAL NEEDLE ASPIRATION BIOPSIES;  Surgeon: Garner Nash, DO;  Location: [facility];  Service: Pulmonary;;   BRONCHIAL WASHINGS  05/15/2021   Procedure: BRONCHIAL WASHINGS;  Surgeon: Garner Nash, DO;  Location: [facility];  Service: Pulmonary;;   VIDEO BRONCHOSCOPY WITH RADIAL ENDOBRONCHIAL ULTRASOUND  05/15/2021   Procedure: RADIAL ENDOBRONCHIAL ULTRASOUND;  Surgeon: Garner Nash, DO;  Location: [facility];  Service: Pulmonary;;    FAMILY HISTORY:  Family History  Problem Relation Age of Onset   Alzheimer's disease Mother    Cancer Father    Lung  cancer Father     SOCIAL HISTORY:  Social History   Tobacco Use   Smoking status: Former    Packs/day: 0.50    Years: 50.00    Pack years: 25.00    Types: Cigarettes    Quit date: 04/04/2021    Years since quitting: 0.1    Smokeless tobacco: Never  Vaping Use   Vaping Use: Never used  Substance Use Topics   Alcohol use: Never   Drug use: Never    ALLERGIES:  Allergies  Allergen Reactions   Influenza Vaccines Other (See Comments)    Sick, ended up in the ICU   Other Other (See Comments)    Flu Vaccine Sick    MEDICATIONS:  Current Outpatient Medications  Medication Sig Dispense Refill   aspirin EC 81 MG tablet Take 81 mg by mouth every other day. Swallow whole.     Biotin 5000 MCG TABS Take 5,000 mcg by mouth daily.     Cholecalciferol (VITAMIN D-3) 125 MCG (5000 UT) TABS Take 5,000 Units by mouth daily.     EUTHYROX 50 MCG tablet Take 50 mcg by mouth every morning.     fluticasone (FLONASE) 50 MCG/ACT nasal spray Place 1 spray into both nostrils daily.     gabapentin (NEURONTIN) 800 MG tablet Take 800 mg by mouth 4 (four) times daily.     HYDROcodone-acetaminophen (NORCO) 10-325 MG tablet Take 1 tablet by mouth 4 (four) times daily as needed for moderate pain or severe pain.     metoprolol succinate (TOPROL-XL) 25 MG 24 hr tablet Take 25 mg by mouth daily.     Polyethylene Glycol 400 (BLINK TEARS) 0.25 % SOLN Place 1 drop into both eyes daily.     Vitamin A 2400 MCG (8000 UT) CAPS Take 2,400 mg by mouth daily.     vitamin B-12 (CYANOCOBALAMIN) 1000 MCG tablet Take 1,000 mcg by mouth daily.     vitamin C (ASCORBIC ACID) 500 MG tablet Take 500 mg by mouth daily.     vitamin E 1000 UNIT capsule Take 1,000 Units by mouth daily.     zinc gluconate 50 MG tablet Take 50 mg by mouth daily.     No current facility-administered medications for this encounter.    REVIEW OF SYSTEMS:  A 10+ POINT REVIEW OF SYSTEMS WAS OBTAINED including neurology, dermatology, psychiatry, cardiac, respiratory, lymph, extremities, GI, GU, musculoskeletal, constitutional, reproductive, HEENT.  She denies any pain within the chest area significant cough or hemoptysis.  She denies any breathing issues.  Her most significant issue  is low back pain related to her scoliosis.  She discussed that she is seeing Dr. Vertell Limber in neurosurgery and he would recommend surgery after she has been cleared from her early stage lung cancer presentation.  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  Her back pain is making it very difficult for her to work.  She reports some headaches which have increased recently related to the stress associated with her diagnosis.  She denies any blurred vision or double vision.  Good friend denies any issues of confusion.   PHYSICAL EXAM:  height is _0  (1.753 m) and weight is 99 lb (44.9 kg). Her temperature is 97.9 F (36.6 C). Her blood pressure is 206/91 (abnormal) and her pulse is 71. Her respiration is 20 and oxygen saturation is 98%.   General: Alert and oriented, in no acute distress HEENT: Head is normocephalic. Extraocular movements are intact. Oropharynx is clear.  No mucosal lesions noted in the oral cavity.  Teeth are in good repair Neck: Neck is supple, no palpable cervical or supraclavicular lymphadenopathy. Heart: Regular in rate and rhythm with no murmurs, rubs, or gallops. Chest: Clear to auscultation bilaterally, with no rhonchi, wheezes, or rales. Abdomen: Soft, nontender, nondistended, with no rigidity or guarding. Extremities: No cyanosis or edema. Lymphatics: see Neck Exam Skin: No concerning lesions. Musculoskeletal: symmetric strength and muscle tone throughout.  Obvious curvature of the spine noticed on exam today Neurologic: Cranial nerves II through XII are grossly intact. No obvious focalities. Speech is fluent. Coordination is intact. Psychiatric: Judgment and insight are intact. Affect is appropriate.   ECOG = 1  0 - Asymptomatic (Fully active, able to carry on all predisease activities without restriction)  1 - Symptomatic but completely ambulatory (Restricted in physically strenuous activity but ambulatory and able to carry out work of a light or sedentary  nature. For example, light housework, office work)  2 - Symptomatic, <50% in bed during the day (Ambulatory and capable of all self care but unable to carry out any work activities. Up and about more than 50% of waking hours)  3 - Symptomatic, >50% in bed, but not bedbound (Capable of only limited self-care, confined to bed or chair 50% or more of waking hours)  4 - Bedbound (Completely disabled. Cannot carry on any self-care. Totally confined to bed or chair)  5 - Death   Eustace Pen MM, Creech RH, Tormey DC, et al. [phone]). "Toxicity and response criteria of the [facility]". [place] Oncol. 5 (6): 649-55  LABORATORY DATA:  Lab Results  Component Value Date   WBC 8.1 05/15/2021   HGB 13.9 05/15/2021   HCT 42.4 05/15/2021   MCV 100.5 (H) 05/15/2021   PLT 215 05/15/2021   Lab Results  Component Value Date   NA 143 05/15/2021   K 3.5 05/15/2021   CL 107 05/15/2021   CO2 29 05/15/2021   GLUCOSE 84 05/15/2021   CREATININE 0.69 05/15/2021   CALCIUM 8.8 (L) 05/15/2021      RADIOGRAPHY: DG CHEST PORT 1 VIEW  Result Date: 05/15/2021 CLINICAL DATA:  Bilateral pulmonary nodules, status post bronchoscopy EXAM: PORTABLE CHEST 1 VIEW COMPARISON:  Multiple exams, including chest CT 05/11/2021 FINDINGS: Bilateral calcified pleural plaques. Biapical pleuroparenchymal scarring. Mildly increased hazy airspace opacity in the left upper lobe, potentially from a small amount of localized hemorrhage. Stable pleural-based scarring/density laterally at the left lung base. Stable right retrocardiac nodularity. Atherosclerotic calcification of the aortic arch. Stable mild blunting of the left lateral costophrenic angle. IMPRESSION: 1. Hazy density in the left upper lobe, nonspecific but possibly reflecting post biopsy blood products or atelectasis. 2. No pneumothorax observed. 3. Stable nodularity at the lung bases and stable biapical pleuroparenchymal scarring with associated  calcifications at the apices. 4.  Aortic Atherosclerosis (ICD10-I70.0). Electronically Signed   By: Van Clines M.D.   On: 05/15/2021 11:19   DG C-Arm 1-60 Min-No Report  Result Date: 05/15/2021 Fluoroscopy was utilized by the requesting physician.  No radiographic interpretation.   CT Super D Chest Wo Contrast  Result Date: 05/12/2021 CLINICAL DATA:  Multiple pulmonary nodules EXAM: CT CHEST WITHOUT CONTRAST TECHNIQUE: Multidetector CT imaging of the chest was performed using thin slice collimation for electromagnetic bronchoscopy planning purposes, without intravenous contrast. COMPARISON:  PET-CT dated 04/16/21.  CT chest dated 02/23/2021. FINDINGS: Cardiovascular: Heart is normal in size.  No pericardial effusion. No evidence of thoracic aortic aneurysm.  Atherosclerotic calcifications of the aortic arch. Coronary atherosclerosis of the LAD. Mediastinum/Nodes: No suspicious mediastinal lymphadenopathy. Stable 16 mm left thyroid nodule (series 3/image 7). Lungs/Pleura: Biapical pleural-parenchymal scarring. Mild centrilobular and paraseptal emphysematous changes. Scattered areas of bronchiectasis with upper lung volume loss and peribronchovascular nodularity, suggesting sequela of chronic atypical mycobacterial infection. Dominant 1.7 x 2.3 cm irregular nodule in the medial right lower lobe (series 6/image 108), unchanged. Additional dominant 13 x 10 mm irregular nodule in the anterior left upper lobe (series 6/image 39), previously 12 x 10 mm, grossly unchanged. Small right pleural effusion, new from prior CT and progressive from prior PET. No pneumothorax. Upper Abdomen: Visualized upper abdomen is grossly normal, noting vascular calcifications. Musculoskeletal: Mild degenerative changes of the visualized thoracolumbar spine. IMPRESSION: Dominant bilateral pulmonary nodules, as above, unchanged. 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 Small right pleural effusion, mildly  progressive. Aortic Atherosclerosis (ICD10-I70.0) and Emphysema (ICD10-J43.9). Electronically Signed   By: Julian Hy M.D.   On: 05/12/2021 09:19   DG C-ARM BRONCHOSCOPY  Result Date: 05/15/2021 C-ARM BRONCHOSCOPY: Fluoroscopy was utilized by the requesting physician.  No radiographic interpretation.      IMPRESSION: Adenocarcinoma of the RLL, clinical stage I   also noted to have a suspicious lesion in the left upper lobe which showed increased SUV uptake on PET scan.  Fine-needle aspiration of this area showed atypical cells but not diagnostic of malignancy.  I discussed recommendations of the multidisciplinary conference with the patient and her friend today.  We discussed that recommendations were for radiation therapy along the right lung with consideration for SBRT if technically possible.  Given the location of this lesion adjacent to the esophageal area will be difficult to do SBRT but ultra hypofractionated radiation therapy would  be an option for her.  We discussed findings from the conference that she could be evaluated with a CT-guided biopsy of the left upper lobe lesion to confirm malignancy.  I discussed this procedure with the patient and her friend and at this point she does not wish to have this procedure done to potentially document malignancy in the left upper lobe.  She feels most comfortable with proceeding with radiation therapy directed at the left upper lobe lesion as well as her right lower lobe lesion at the same time.  She would be a candidate for SBRT, likely 3 fractions of the left upper lobe lesion based on the size and location of this lesion.  We discussed potential brain MRI for staging issues but she does not wish to consider this study but will reconsider if she develops symptoms related to possible brain metastasis.   We discussed the available radiation techniques, and focused on the details of logistics and delivery.  We reviewed the anticipated acute and  late sequelae associated with radiation in this setting.  The patient was encouraged to ask questions that I answered to the best of my ability.  A patient consent form was discussed and signed.  We retained a copy for our records.  The patient would like to proceed with radiation and will be scheduled for CT simulation.  PLAN: She will return for CT simulation on January 23 with treatments to begin approximately 7 to 10 days later.  Anticipate 3 fractions directed at the left upper lobe lesion.  Anticipate between 5 and 8 fractions directed at the medially placed right lower lobe lesion.  She in addition will meet with Dr. Julien Nordmann later this month for medical oncology consultation.  60 minutes of total time was spent for this patient encounter, including preparation, face-to-face counseling with the patient and coordination of care, physical exam, and documentation of the encounter.   ------------------------------------------------  Blair Promise, PhD, MD  This document serves as a record of services personally performed by Gery Pray, MD. It was created on his behalf by Roney Mans, a trained medical scribe. The creation of this record is based on the scribe's personal observations and the provider's statements to them. This document has been checked and approved by the attending provider.

## 2021-06-06 ENCOUNTER — Other Ambulatory Visit: Payer: Self-pay

## 2021-06-06 ENCOUNTER — Encounter: Payer: Self-pay | Admitting: Radiation Oncology

## 2021-06-06 ENCOUNTER — Ambulatory Visit
Admission: RE | Admit: 2021-06-06 | Discharge: 2021-06-06 | Disposition: A | Discharge status: Home / Self Care | Payer: Medicare Other | Source: Ambulatory Visit | Attending: Radiation Oncology | Admitting: Radiation Oncology

## 2021-06-06 VITALS — BP 206/91 | HR 71 | Temp 97.9°F | Resp 20 | Ht 69.0 in | Wt 99.0 lb

## 2021-06-06 DIAGNOSIS — J432 Centrilobular emphysema: Secondary | ICD-10-CM | POA: Diagnosis not present

## 2021-06-06 DIAGNOSIS — J90 Pleural effusion, not elsewhere classified: Secondary | ICD-10-CM | POA: Insufficient documentation

## 2021-06-06 DIAGNOSIS — I10 Essential (primary) hypertension: Secondary | ICD-10-CM | POA: Insufficient documentation

## 2021-06-06 DIAGNOSIS — Z87891 Personal history of nicotine dependence: Secondary | ICD-10-CM | POA: Insufficient documentation

## 2021-06-06 DIAGNOSIS — Z801 Family history of malignant neoplasm of trachea, bronchus and lung: Secondary | ICD-10-CM | POA: Diagnosis not present

## 2021-06-06 DIAGNOSIS — A319 Mycobacterial infection, unspecified: Secondary | ICD-10-CM | POA: Diagnosis not present

## 2021-06-06 DIAGNOSIS — R918 Other nonspecific abnormal finding of lung field: Secondary | ICD-10-CM | POA: Diagnosis not present

## 2021-06-06 DIAGNOSIS — Z79899 Other long term (current) drug therapy: Secondary | ICD-10-CM | POA: Insufficient documentation

## 2021-06-06 DIAGNOSIS — C3431 Malignant neoplasm of lower lobe, right bronchus or lung: Secondary | ICD-10-CM | POA: Insufficient documentation

## 2021-06-06 NOTE — Progress Notes (Signed)
See MD note for nursing evaluation. °

## 2021-06-11 ENCOUNTER — Ambulatory Visit: Payer: Medicare Other | Admitting: Radiation Oncology

## 2021-06-11 ENCOUNTER — Ambulatory Visit: Payer: Medicare Other | Admitting: Pulmonary Disease

## 2021-06-14 ENCOUNTER — Ambulatory Visit
Admission: RE | Admit: 2021-06-14 | Discharge: 2021-06-14 | Disposition: A | Discharge status: Home / Self Care | Payer: Medicare Other | Source: Ambulatory Visit | Attending: Radiation Oncology | Admitting: Radiation Oncology

## 2021-06-14 ENCOUNTER — Other Ambulatory Visit: Payer: Self-pay

## 2021-06-14 DIAGNOSIS — C3431 Malignant neoplasm of lower lobe, right bronchus or lung: Secondary | ICD-10-CM

## 2021-06-18 ENCOUNTER — Ambulatory Visit (INDEPENDENT_AMBULATORY_CARE_PROVIDER_SITE_OTHER): Payer: Medicare Other | Admitting: Pulmonary Disease

## 2021-06-18 ENCOUNTER — Other Ambulatory Visit: Payer: Self-pay

## 2021-06-18 ENCOUNTER — Inpatient Hospital Stay: Payer: Medicare Other | Attending: Internal Medicine | Admitting: Internal Medicine

## 2021-06-18 ENCOUNTER — Encounter: Payer: Self-pay | Admitting: Pulmonary Disease

## 2021-06-18 ENCOUNTER — Encounter: Payer: Self-pay | Admitting: Internal Medicine

## 2021-06-18 VITALS — BP 164/88 | HR 66 | Temp 98.4°F | Ht 69.0 in | Wt 97.2 lb

## 2021-06-18 VITALS — BP 174/83 | HR 70 | Temp 98.1°F | Resp 17 | Ht 69.0 in | Wt 97.6 lb

## 2021-06-18 DIAGNOSIS — R918 Other nonspecific abnormal finding of lung field: Secondary | ICD-10-CM

## 2021-06-18 DIAGNOSIS — R942 Abnormal results of pulmonary function studies: Secondary | ICD-10-CM

## 2021-06-18 DIAGNOSIS — M351 Other overlap syndromes: Secondary | ICD-10-CM

## 2021-06-18 DIAGNOSIS — C3491 Malignant neoplasm of unspecified part of right bronchus or lung: Secondary | ICD-10-CM | POA: Diagnosis not present

## 2021-06-18 DIAGNOSIS — Z87891 Personal history of nicotine dependence: Secondary | ICD-10-CM

## 2021-06-18 DIAGNOSIS — C3431 Malignant neoplasm of lower lobe, right bronchus or lung: Secondary | ICD-10-CM

## 2021-06-18 DIAGNOSIS — I10 Essential (primary) hypertension: Secondary | ICD-10-CM | POA: Diagnosis not present

## 2021-06-18 DIAGNOSIS — I7300 Raynaud's syndrome without gangrene: Secondary | ICD-10-CM

## 2021-06-18 DIAGNOSIS — G8929 Other chronic pain: Secondary | ICD-10-CM

## 2021-06-18 DIAGNOSIS — M069 Rheumatoid arthritis, unspecified: Secondary | ICD-10-CM | POA: Diagnosis not present

## 2021-06-18 DIAGNOSIS — C3490 Malignant neoplasm of unspecified part of unspecified bronchus or lung: Secondary | ICD-10-CM

## 2021-06-18 DIAGNOSIS — M549 Dorsalgia, unspecified: Secondary | ICD-10-CM

## 2021-06-18 DIAGNOSIS — M419 Scoliosis, unspecified: Secondary | ICD-10-CM

## 2021-06-18 DIAGNOSIS — Z801 Family history of malignant neoplasm of trachea, bronchus and lung: Secondary | ICD-10-CM

## 2021-06-18 NOTE — Patient Instructions (Addendum)
Thank you for visiting Dr. Valeta Harms at [facility]. Today we recommend the following:  Call us if needed.   Return in about 9 months (around 03/18/2022) for with Eric Form, NP, or Dr. Valeta Harms.    Please do your part to reduce the spread of COVID-19.

## 2021-06-18 NOTE — Progress Notes (Signed)
Synopsis: Referred in November 2022 for lung nodule by No ref. provider found  Subjective:   PATIENT ID: Robin Bryant GENDER: female DOB: [birth year], MRN: [id number]  Chief Complaint  Patient presents with   Follow-up    Follow up    This is a 71 year old female, past medical history hypertension, scoliosis, referred for evaluation of pulmonary nodules.  Patient had a CT of the thoracic spine that was completed by Dr. Vertell Limber from neurosurgery.  Imaging revealed mild bronchiectasis with areas of mucoid impaction within the lung and predominantly within the right middle lobe suggestive of NTM or MAC.  Also found to have bilateral pulmonary nodules within the lung.  Largest within the medial portion of the right lower lobe at 1.8 x 2.3 cm in size.  Patient was referred for evaluation.  Imaging complete and reviewed today in the office.  OV 04/23/2021: Patient here today for follow-up after nuclear medicine pet imaging.Patient had nuclear medicine PET imaging on 04/16/2021.  This revealed the same right lower lobe nodule with measurement of 1.8 x 1.6 cm SUV max of 9.  Additionally the left upper lobe nodules scattered throughout the lung have hypermetabolic uptake.  Concern for either infectious inflammatory disease within the right upper lobe and potentially a primary bronchogenic carcinoma within the right lower lobe.  I reviewed the images today with the patient in the office as well as her friend present.  We discussed all of next possible steps.  OV 06/18/2021: Here today for evaluation after bronchoscopy.Patient was taken for robotic assisted navigational bronchoscopy with tissue sampling on 05/15/2021.  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  I suspect we are dealing with 2 primaries.  After discussion undergoing repeat biopsy or consideration for moving forward with empiric treatments to the left  decision was made to go ahead and treat empirically.  Therefore she will have radiation treatments to both locations.  She also has an appointment scheduled with Dr. Julien Nordmann.  She started her radiation treatments on 06/14/2021.   Past Medical History:  Diagnosis Date   Back pain    HTN (hypertension)    Scoliosis    Sinus congestion      Family History  Problem Relation Age of Onset   Alzheimer's disease Mother    Cancer Father    Lung cancer Father      Past Surgical History:  Procedure Laterality Date   ABDOMINAL HYSTERECTOMY     BACK SURGERY     BRONCHIAL BIOPSY  05/15/2021   Procedure: BRONCHIAL BIOPSIES;  Surgeon: Garner Nash, DO;  Location: [facility];  Service: Pulmonary;;   BRONCHIAL BRUSHINGS  05/15/2021   Procedure: BRONCHIAL BRUSHINGS;  Surgeon: Garner Nash, DO;  Location: [facility];  Service: Pulmonary;;   BRONCHIAL NEEDLE ASPIRATION BIOPSY  05/15/2021   Procedure: BRONCHIAL NEEDLE ASPIRATION BIOPSIES;  Surgeon: Garner Nash, DO;  Location: [facility];  Service: Pulmonary;;   BRONCHIAL WASHINGS  05/15/2021   Procedure: BRONCHIAL WASHINGS;  Surgeon: Garner Nash, DO;  Location: [facility];  Service: Pulmonary;;   VIDEO BRONCHOSCOPY WITH RADIAL ENDOBRONCHIAL ULTRASOUND  05/15/2021   Procedure: RADIAL ENDOBRONCHIAL ULTRASOUND;  Surgeon: Garner Nash, DO;  Location: [facility];  Service: Pulmonary;;    Social History   Socioeconomic History   Marital status: Divorced    Spouse name: Not on file   Number of children: Not on file   Years  of education: Not on file   Highest education level: Not on file  Occupational History   Not on file  Tobacco Use   Smoking status: Former    Packs/day: 0.50    Years: 50.00    Pack years: 25.00    Types: Cigarettes    Quit date: 04/04/2021    Years since quitting: 0.2   Smokeless tobacco: Never  Vaping Use   Vaping Use: Never used  Substance and Sexual Activity   Alcohol use: Never   Drug  use: Never   Sexual activity: Not on file  Other Topics Concern   Not on file  Social History Narrative   Not on file   Social Determinants of Health   Financial Resource Strain: Not on file  Food Insecurity: Not on file  Transportation Needs: Not on file  Physical Activity: Not on file  Stress: Not on file  Social Connections: Not on file  Intimate Partner Violence: Not on file     Allergies  Allergen Reactions   Influenza Vaccines Other (See Comments)    Sick, ended up in the ICU   Other Other (See Comments)    Flu Vaccine Sick     Outpatient Medications Prior to Visit  Medication Sig Dispense Refill   aspirin EC 81 MG tablet Take 81 mg by mouth every other day. Swallow whole.     Biotin 5000 MCG TABS Take 5,000 mcg by mouth daily.     Cholecalciferol (VITAMIN D-3) 125 MCG (5000 UT) TABS Take 5,000 Units by mouth daily.     EUTHYROX 50 MCG tablet Take 50 mcg by mouth every morning.     fluticasone (FLONASE) 50 MCG/ACT nasal spray Place 1 spray into both nostrils daily.     gabapentin (NEURONTIN) 800 MG tablet Take 800 mg by mouth 4 (four) times daily.     HYDROcodone-acetaminophen (NORCO) 10-325 MG tablet Take 1 tablet by mouth 4 (four) times daily as needed for moderate pain or severe pain.     metoprolol succinate (TOPROL-XL) 25 MG 24 hr tablet Take 25 mg by mouth daily.     Polyethylene Glycol 400 (BLINK TEARS) 0.25 % SOLN Place 1 drop into both eyes daily.     Vitamin A 2400 MCG (8000 UT) CAPS Take 2,400 mg by mouth daily.     vitamin B-12 (CYANOCOBALAMIN) 1000 MCG tablet Take 1,000 mcg by mouth daily.     vitamin C (ASCORBIC ACID) 500 MG tablet Take 500 mg by mouth daily.     vitamin E 1000 UNIT capsule Take 1,000 Units by mouth daily.     zinc gluconate 50 MG tablet Take 50 mg by mouth daily.     No facility-administered medications prior to visit.    Review of Systems  Constitutional:  Negative for chills, fever, malaise/fatigue and weight loss.  HENT:   Negative for hearing loss, sore throat and tinnitus.   Eyes:  Negative for blurred vision and double vision.  Respiratory:  Negative for cough, hemoptysis, sputum production, shortness of breath, wheezing and stridor.   Cardiovascular:  Negative for chest pain, palpitations, orthopnea, leg swelling and PND.  Gastrointestinal:  Negative for abdominal pain, constipation, diarrhea, heartburn, nausea and vomiting.  Genitourinary:  Negative for dysuria, hematuria and urgency.  Musculoskeletal:  Positive for back pain. Negative for joint pain and myalgias.  Skin:  Negative for itching and rash.  Neurological:  Negative for dizziness, tingling, weakness and headaches.  Endo/Heme/Allergies:  Negative for environmental allergies. Does  not bruise/bleed easily.  Psychiatric/Behavioral:  Negative for depression. The patient is not nervous/anxious and does not have insomnia.   All other systems reviewed and are negative.   Objective:  Physical Exam Vitals reviewed.  Constitutional:      General: She is not in acute distress.    Appearance: She is well-developed.     Comments: Thin low BMI  HENT:     Head: Normocephalic and atraumatic.  Eyes:     General: No scleral icterus.    Conjunctiva/sclera: Conjunctivae normal.     Pupils: Pupils are equal, round, and reactive to light.  Neck:     Vascular: No JVD.     Trachea: No tracheal deviation.  Cardiovascular:     Rate and Rhythm: Normal rate and regular rhythm.     Heart sounds: Normal heart sounds. No murmur heard. Pulmonary:     Effort: Pulmonary effort is normal. No tachypnea, accessory muscle usage or respiratory distress.     Breath sounds: No stridor. No wheezing, rhonchi or rales.  Abdominal:     General: Bowel sounds are normal. There is no distension.     Tenderness: There is no abdominal tenderness.  Musculoskeletal:        General: Deformity present. No tenderness.     Cervical back: Neck supple.     Comments: Severe  kyphoscoliosis  Lymphadenopathy:     Cervical: No cervical adenopathy.  Skin:    General: Skin is warm and dry.     Capillary Refill: Capillary refill takes less than 2 seconds.     Findings: No rash.  Neurological:     Mental Status: She is alert and oriented to person, place, and time.  Psychiatric:        Behavior: Behavior normal.     Vitals:   06/18/21 1202  BP: (!) 164/88  Pulse: 66  Temp: 98.4 F (36.9 C)  TempSrc: Oral  SpO2: 95%  Weight: 97 lb 3.2 oz (44.1 kg)  Height: _0  (1.753 m)   95% on RA BMI Readings from Last 3 Encounters:  06/18/21 14.35 kg/m  06/06/21 14.62 kg/m  05/15/21 14.18 kg/m   Wt Readings from Last 3 Encounters:  06/18/21 97 lb 3.2 oz (44.1 kg)  06/06/21 99 lb (44.9 kg)  05/15/21 96 lb (43.5 kg)     CBC    Component Value Date/Time   WBC 8.1 05/15/2021 0729   RBC 4.22 05/15/2021 0729   HGB 13.9 05/15/2021 0729   HCT 42.4 05/15/2021 0729   PLT 215 05/15/2021 0729   MCV 100.5 (H) 05/15/2021 0729   MCH 32.9 05/15/2021 0729   MCHC 32.8 05/15/2021 0729   RDW 13.7 05/15/2021 0729    Chest Imaging: 03/05/2021 CT thoracic spine: Mild bronchiectasis mucoid impaction and disease within the right middle lobe concerning for NTM.  There is bilateral pulmonary nodules.  Largest within the right lower lobe at 1.8 x 2.3 cm in size. The patient's images have been independently reviewed by me.    04/16/2021 nuclear medicine pet imaging: 1.8 cm right lower lobe pleural-based pulmonary nodule hypermetabolic with SUV of 9 concerning for primary bronchogenic carcinoma. The patient's images have been independently reviewed by me.    Pulmonary Functions Testing Results: No flowsheet data found.  FeNO:   Pathology:   Echocardiogram:   Heart Catheterization:     Assessment & Plan:     ICD-10-CM   1. Adenocarcinoma of right lung (HCC)  C34.91     2.  Multiple lung nodules  R91.8     3. Former smoker  Z87.891     4. Abnormal PET of  right lung  R94.2        Discussion:   This is a 71 year old female, incidentally found pulm multiple pulmonary nodules largest within the right lower lobe at 1.8 cm, hypermetabolic on PET scan concerning for primary bronchogenic carcinoma diagnosed with an adenocarcinoma of the right lung after robotic assisted navigational bronchoscopy.  Also had biopsies to the left upper lobe which showed atypical cells.  Patient is a former smoker recently quit in November of this past year.  Plan: Patient has already established care with radiation oncology as well as medical oncology. Medical oncology appointment is this afternoon. Patient is set up to start SBRT treatments. She can follow-up with [place] after treatments are complete. If she is having any change in symptoms from a respiratory standpoint happy to see her. Return to clinic in approximately 9 months to 1 year.    Current Outpatient Medications:    aspirin EC 81 MG tablet, Take 81 mg by mouth every other day. Swallow whole., Disp: , Rfl:    Biotin 5000 MCG TABS, Take 5,000 mcg by mouth daily., Disp: , Rfl:    Cholecalciferol (VITAMIN D-3) 125 MCG (5000 UT) TABS, Take 5,000 Units by mouth daily., Disp: , Rfl:    EUTHYROX 50 MCG tablet, Take 50 mcg by mouth every morning., Disp: , Rfl:    fluticasone (FLONASE) 50 MCG/ACT nasal spray, Place 1 spray into both nostrils daily., Disp: , Rfl:    gabapentin (NEURONTIN) 800 MG tablet, Take 800 mg by mouth 4 (four) times daily., Disp: , Rfl:    HYDROcodone-acetaminophen (NORCO) 10-325 MG tablet, Take 1 tablet by mouth 4 (four) times daily as needed for moderate pain or severe pain., Disp: , Rfl:    metoprolol succinate (TOPROL-XL) 25 MG 24 hr tablet, Take 25 mg by mouth daily., Disp: , Rfl:    Polyethylene Glycol 400 (BLINK TEARS) 0.25 % SOLN, Place 1 drop into both eyes daily., Disp: , Rfl:    Vitamin A 2400 MCG (8000 UT) CAPS, Take 2,400 mg by mouth daily., Disp: , Rfl:    vitamin B-12  (CYANOCOBALAMIN) 1000 MCG tablet, Take 1,000 mcg by mouth daily., Disp: , Rfl:    vitamin C (ASCORBIC ACID) 500 MG tablet, Take 500 mg by mouth daily., Disp: , Rfl:    vitamin E 1000 UNIT capsule, Take 1,000 Units by mouth daily., Disp: , Rfl:    zinc gluconate 50 MG tablet, Take 50 mg by mouth daily., Disp: , Rfl:     Garner Nash, DO Post Pulmonary Critical Care 06/18/2021 12:08 PM

## 2021-06-18 NOTE — Progress Notes (Signed)
[facility] [phone]   [phone]  CONSULT NOTE  REFERRING PHYSICIAN: Dr. Leory Plowman Icard  REASON FOR CONSULTATION:  71 years old white female recently diagnosed with lung cancer.  HPI Robin Bryant is a 71 y.o. female with past medical history significant for hypertension, rheumatoid arthritis, mixed connective tissue disease, Raynaud's phenomena as well as chronic back pain and scoliosis.  The patient also has a long history of smoking but quit in December 2022.  The patient mentioned that she was seen by neurosurgery Dr. Vertell Limber for chronic back pain and he ordered MRI of the spine that showed severe scoliosis.  She was supposed to have surgery but he was out of the office for a medical condition.  Her MRI of the spine showed incidental finding of pulmonary nodules in the right lower lobe as well as suspicious opacity in the left lung.  This was followed by CT scan of the chest on March 05, 2021 and it showed bilateral pulmonary nodules measuring up to 1.8 x 2.3 cm in the medial right lower lobe.  There was also subpleural scarring in the peripheral left lower lobe.  A PET scan was performed on April 16, 2021 and that showed no thoracic nodal hypermetabolism.  A left upper lobe pulmonary nodule measured 0.8 cm with SUV of 2.6.  More anterior left upper lobe pulmonary nodule measured 1.0 cm with SUV of 4.6.  A lingular nodule measuring 0.6 cm with SUV of 1.6.  There was also a pleural-based right lower lobe dominant irregular pulmonary nodule measuring 1.8 x 1.6 with SUV max of 9.0.  There was no evidence for extrathoracic hypermetabolic metastasis.  The patient was referred to Dr. Valeta Harms and CT super D of the chest was performed on May 11, 2021 for preparation of the navigational bronchoscopy.  On May 15, 2021 the patient underwent video bronchoscopy with robotic assisted bronchoscopic navigation under the care of Dr. Valeta Harms.  The final cytology  ([id number]) of the right lower lobe brushing and fine-needle aspiration showed malignant cells consistent with adenocarcinoma.  The fine-needle aspiration as well as the brushing of the left upper lobe lung nodules showed atypical cells present. The patient was referred to Dr. Sondra Come for consideration of SBRT.  She was also referred to me today for recommendation regarding her condition. When seen today she is feeling fine except for the chronic back pain.  She denied having any current chest pain, shortness of breath, cough or hemoptysis.  She lost few pounds recently.  She denied having any headache or visual changes.  She has no nausea, vomiting, diarrhea or constipation.  She is expected to start the first dose of radiation on June 27, 2021 and to be completed on July 10, 2021. Family history significant for mother with Alzheimer's and died at age 93.  Father had lung cancer and also died at age 62. The patient is single and has 1 daughter.  She works as a Theme park manager.  She was accompanied by her friend [name].  She has a history of smoking 1 pack/day for around 52 years and quit in December 2022.  She has no history of alcohol or drug abuse. HPI  Past Medical History:  Diagnosis Date   Back pain    HTN (hypertension)    Scoliosis    Sinus congestion     Past Surgical History:  Procedure Laterality Date   ABDOMINAL HYSTERECTOMY     BACK SURGERY     BRONCHIAL  BIOPSY  05/15/2021   Procedure: BRONCHIAL BIOPSIES;  Surgeon: Garner Nash, DO;  Location: [facility];  Service: Pulmonary;;   BRONCHIAL BRUSHINGS  05/15/2021   Procedure: BRONCHIAL BRUSHINGS;  Surgeon: Garner Nash, DO;  Location: [facility];  Service: Pulmonary;;   BRONCHIAL NEEDLE ASPIRATION BIOPSY  05/15/2021   Procedure: BRONCHIAL NEEDLE ASPIRATION BIOPSIES;  Surgeon: Garner Nash, DO;  Location: [facility];  Service: Pulmonary;;   BRONCHIAL WASHINGS  05/15/2021   Procedure: BRONCHIAL WASHINGS;   Surgeon: Garner Nash, DO;  Location: [facility];  Service: Pulmonary;;   VIDEO BRONCHOSCOPY WITH RADIAL ENDOBRONCHIAL ULTRASOUND  05/15/2021   Procedure: RADIAL ENDOBRONCHIAL ULTRASOUND;  Surgeon: Garner Nash, DO;  Location: [facility];  Service: Pulmonary;;    Family History  Problem Relation Age of Onset   Alzheimer's disease Mother    Cancer Father    Lung cancer Father     Social History Social History   Tobacco Use   Smoking status: Former    Packs/day: 0.50    Years: 50.00    Pack years: 25.00    Types: Cigarettes    Quit date: 04/04/2021    Years since quitting: 0.2   Smokeless tobacco: Never  Vaping Use   Vaping Use: Never used  Substance Use Topics   Alcohol use: Never   Drug use: Never    Allergies  Allergen Reactions   Influenza Vaccines Other (See Comments)    Sick, ended up in the ICU   Other Other (See Comments)    Flu Vaccine Sick    Current Outpatient Medications  Medication Sig Dispense Refill   aspirin EC 81 MG tablet Take 81 mg by mouth every other day. Swallow whole.     Biotin 5000 MCG TABS Take 5,000 mcg by mouth daily.     Cholecalciferol (VITAMIN D-3) 125 MCG (5000 UT) TABS Take 5,000 Units by mouth daily.     EUTHYROX 50 MCG tablet Take 50 mcg by mouth every morning.     fluticasone (FLONASE) 50 MCG/ACT nasal spray Place 1 spray into both nostrils daily.     gabapentin (NEURONTIN) 800 MG tablet Take 800 mg by mouth 4 (four) times daily.     HYDROcodone-acetaminophen (NORCO) 10-325 MG tablet Take 1 tablet by mouth 4 (four) times daily as needed for moderate pain or severe pain.     metoprolol succinate (TOPROL-XL) 25 MG 24 hr tablet Take 25 mg by mouth daily.     Polyethylene Glycol 400 (BLINK TEARS) 0.25 % SOLN Place 1 drop into both eyes daily.     Vitamin A 2400 MCG (8000 UT) CAPS Take 2,400 mg by mouth daily.     vitamin B-12 (CYANOCOBALAMIN) 1000 MCG tablet Take 1,000 mcg by mouth daily.     vitamin C (ASCORBIC ACID) 500  MG tablet Take 500 mg by mouth daily.     vitamin E 1000 UNIT capsule Take 1,000 Units by mouth daily.     zinc gluconate 50 MG tablet Take 50 mg by mouth daily.     No current facility-administered medications for this visit.    Review of Systems  Constitutional: positive for weight loss Eyes: negative Ears, nose, mouth, throat, and face: negative Respiratory: negative Cardiovascular: negative Gastrointestinal: negative Genitourinary:negative Integument/breast: negative Hematologic/lymphatic: negative Musculoskeletal:positive for back pain Neurological: negative Behavioral/Psych: negative Endocrine: negative Allergic/Immunologic: negative  Physical Exam  HUD:JSHFW, healthy, no distress, well nourished, well developed, and anxious SKIN: skin color, texture, turgor are normal, no rashes  or significant lesions HEAD: Normocephalic, No masses, lesions, tenderness or abnormalities EYES: normal, PERRLA, Conjunctiva are pink and non-injected EARS: External ears normal, Canals clear OROPHARYNX:no exudate, no erythema, and lips, buccal mucosa, and tongue normal  NECK: supple, no adenopathy, no JVD LYMPH:  no palpable lymphadenopathy, no hepatosplenomegaly BREAST:not examined LUNGS: clear to auscultation , and palpation HEART: regular rate & rhythm, no murmurs, and no gallops ABDOMEN:abdomen soft, non-tender, normal bowel sounds, and no masses or organomegaly BACK: Back symmetric, no curvature., No CVA tenderness EXTREMITIES:no joint deformities, effusion, or inflammation, no edema  NEURO: alert & oriented x 3 with fluent speech, no focal motor/sensory deficits  PERFORMANCE STATUS: ECOG 1  LABORATORY DATA: Lab Results  Component Value Date   WBC 8.1 05/15/2021   HGB 13.9 05/15/2021   HCT 42.4 05/15/2021   MCV 100.5 (H) 05/15/2021   PLT 215 05/15/2021      Chemistry      Component Value Date/Time   NA 143 05/15/2021 0729   K 3.5 05/15/2021 0729   CL 107 05/15/2021 0729    CO2 29 05/15/2021 0729   BUN 15 05/15/2021 0729   CREATININE 0.69 05/15/2021 0729      Component Value Date/Time   CALCIUM 8.8 (L) 05/15/2021 0729       RADIOGRAPHIC STUDIES: No results found.  ASSESSMENT: This is a very pleasant 71 years old white female stage Ia (T1b, N0, M0) non-small cell lung cancer, adenocarcinoma involving the right lower lobe diagnosed in December 2022.  The patient also has highly suspicious multifocal disease in the left lung but these are suspicious and not completely confirmed based on the final pathology.  PLAN: I had a lengthy discussion with the patient and her friend today about her current disease stage, prognosis and treatment options. I personally and independently reviewed the scan images and discussed the result and showed the images to the patient and her friend. I explained to the patient that normally we would have consider her for surgical resection but unfortunately she is not a good surgical candidate because of her other comorbidities and the multifocal disease in the left lung. I recommended for the patient to proceed with the SBRT to the right lower lobe lung nodule and also the prominent left upper lobe lung nodules as recommended by Dr. Sondra Come. I will complete the staging work-up by ordering MRI of the brain to rule out brain metastasis. I will also request her tissue biopsy to be sent to [facility] for molecular studies if there is sufficient material for testing. I will see the patient back for follow-up visit in 4 months for evaluation and repeat CT scan of the chest for restaging of her disease after the SBRT. I also strongly encouraged the patient to continue with the smoking cessation. The patient is in agreement with the current plan. She was advised to call immediately if she has any other concerning symptoms in the interval.  The patient voices understanding of current disease status and treatment options and is in agreement  with the current care plan.  All questions were answered. The patient knows to call the clinic with any problems, questions or concerns. We can certainly see the patient much sooner if necessary.  Thank you so much for allowing me to participate in the care of Robin Bryant. I will continue to follow up the patient with you and assist in her care.  The total time spent in the appointment was 60 minutes.  Disclaimer: This note was  dictated with voice recognition software. Similar sounding words can inadvertently be transcribed and may not be corrected upon review.   Eilleen Kempf June 18, 2021, 2:25 PM

## 2021-06-18 NOTE — Patient Instructions (Signed)
Dr. Vertell Limber office number [phone] Lung Cancer Lung cancer is an abnormal growth of cancerous cells that forms a mass (malignant tumor) in a lung. There are several types of lung cancer. The types are based on the appearance of the tumor cells. The two most common types are: Non-small cell lung cancer. This type of lung cancer is the most common type. Non-small cell lung cancers include squamous cell carcinoma, adenocarcinoma, and large cell carcinoma. Small cell lung cancer. In this type of lung cancer, abnormal cells are smaller than those of non-small cell lung cancer. Small cell lung cancer gets worse (progresses) faster than non-small cell lung cancer. What are the causes? The most common cause of lung cancer is smoking tobacco. The second most common cause is exposure to a chemical called radon. What increases the risk? You are more likely to develop this condition if: You smoke tobacco. You have been exposed to: Secondhand tobacco smoke. Radon gas. Uranium. Asbestos. Arsenic in drinking water. Air pollution and diesel exhaust. You have a family or personal history of lung cancer. You have had lung radiation therapy in the past. You are older than age 55. What are the signs or symptoms? In the early stages, you may not have any symptoms. As the cancer progresses, symptoms may include: A lasting cough, possibly with blood. Fatigue. Unexplained weight loss. Shortness of breath. High-pitched whistling sounds when you breathe, most often when you breathe out (wheezing). Chest pain. Loss of appetite. Symptoms of advanced lung cancer include: Hoarseness. Bone or joint pain. Weakness. Change in the structure of the fingernails (clubbing), so that the nail looks like an upside-down spoon. Swelling of the face or arms. Inability to move the face (paralysis). Drooping eyelids. How is this diagnosed? This condition may be diagnosed based on: Your symptoms and medical history. A  physical exam. A chest X-ray. A CT scan. Blood tests. Sputum tests. Removal of a sample of lung tissue (lung biopsy) for testing. Your cancer will be assessed (staged) to determine how severe it is and how much it has spread (metastasized). How is this treated? Treatment depends on the type and stage of your cancer. Treatment may include one or more of the following: Surgery to remove as much of the cancer as possible. Lymph nodes in the area may be removed and tested for cancer as well. Medicines that kill cancer cells (chemotherapy). High-energy rays that kill cancer cells (radiation therapy). Targeted therapy. This targets specific parts of cancer cells and the area around them to block the growth and spread of the cancer. Targeted therapy can help limit the damage to healthy cells. Immunotherapy. This treatment uses a person's own immune system to fight cancer by either boosting the immune system or changing how the immune system works. Follow these instructions at home:  Do not use any products that contain nicotine or tobacco. These products include cigarettes, chewing tobacco, and vaping devices, such as e-cigarettes. If you need help quitting, ask your health care provider. Do not drink alcohol. If you are admitted to the hospital, make sure your cancer specialist (oncologist) is aware. Your cancer may affect your treatment for other conditions. Take over-the-counter and prescription medicines only as told by your health care provider. Work with your health care provider to manage any side effects of treatment. Keep all follow-up visits. This is important. Where to find support Consider joining a local support group for people who have been diagnosed with lung cancer. Where to find more information American Cancer Society:  www.cancer.[place] www.cancer.gov Contact a health care provider if you: Lose weight without trying. Have a persistent cough and  wheezing. Feel short of breath. Get tired easily. Have bone or joint pain. Have difficulty swallowing. Notice that your voice is changing or getting hoarse. Have pain that does not get better with medicine. Get help right away if you: Cough up blood. Have chest pain or new breathing problems. Have a fever. Have swelling in an ankle, leg, or arm, or the face or neck. Have paralysis in your face. Are very confused. Have a drooping eyelid. These symptoms may represent a serious problem that is an emergency. Do not wait to see if the symptoms will go away. Get medical help right away. Call your local emergency services (911 in the [place] Do not drive yourself to the hospital. Summary Lung cancer is an abnormal growth of cancerous cells that forms a mass (malignant tumor) in a lung. There are several types of lung cancer. The types are based on the appearance of the tumor cells. The two most common types are non-small cell and small cell. The most common cause of lung cancer is smoking tobacco. Early symptoms include a lasting cough, possibly with blood, and fatigue, unexplained weight loss, and shortness of breath. After diagnosis, treatment depends on the type and stage of your cancer. This information is not intended to replace advice given to you by your health care provider. Make sure you discuss any questions you have with your health care provider. Document Revised: 10/25/2020 Document Reviewed: 10/25/2020 Elsevier Patient Education  [place].

## 2021-06-19 ENCOUNTER — Encounter: Payer: Self-pay | Admitting: *Deleted

## 2021-06-19 LAB — FUNGUS CULTURE RESULT

## 2021-06-19 LAB — FUNGUS CULTURE WITH STAIN

## 2021-06-19 LAB — FUNGAL ORGANISM REFLEX

## 2021-06-19 NOTE — Progress Notes (Signed)
Oncology Nurse Navigator Documentation  Oncology Nurse Navigator Flowsheets 06/19/2021 05/28/2021 05/28/2021 05/25/2021  Abnormal Finding Date 03/05/2021 - - -  Confirmed Diagnosis Date 05/15/2021 - - -  Diagnosis Status Pending Molecular Studies - - -  Planned Course of Treatment Radiation - - -  Phase of Treatment Radiation - - -  Radiation Actual Start Date: 06/27/2021 - - -  Navigator Follow Up Date: 07/02/2021 06/18/2021 05/30/2021 05/28/2021  Navigator Follow Up Reason: Radiology New Patient Appointment Patient Call Patient Call  Navigator Location [place]  Referral Date to RadOnc/MedOnc - - - 05/24/2021  Navigator Encounter Type Clinic/MDC;Initial MedOnc Telephone Telephone Telephone  Telephone - Incoming Call [place] Call [place] Call  Patient Visit Type Initial;MedOnc - - -  Treatment Phase Pre-Tx/Tx Discussion Pre-Tx/Tx Discussion Pre-Tx/Tx Discussion Pre-Tx/Tx Discussion  Barriers/Navigation Needs Education Coordination of Care;Education Coordination of Care Coordination of Care;Education  Education Newly Diagnosed Cancer Education;Understanding Cancer/ Treatment Options;Other Other Other Other  Interventions Education;Psycho-Social Support Coordination of Care;Psycho-Social Support;Education Coordination of Care;Education Coordination of Care;Education  Acuity Level 3-Moderate Needs (3-4 Barriers Identified) Level 2-Minimal Needs (1-2 Barriers Identified) Level 2-Minimal Needs (1-2 Barriers Identified) Level 2-Minimal Needs (1-2 Barriers Identified)  Coordination of Care - Appts Other Other  Education Method Verbal;Written Verbal Verbal Verbal  Time Spent with Patient [phone]

## 2021-06-19 NOTE — Progress Notes (Signed)
Oncology Nurse Navigator Documentation  Oncology Nurse Navigator Flowsheets 06/19/2021 06/19/2021 05/28/2021 05/28/2021 05/25/2021  Abnormal Finding Date - 03/05/2021 - - -  Confirmed Diagnosis Date - 05/15/2021 - - -  Diagnosis Status - Pending Molecular Studies - - -  Planned Course of Treatment - Radiation - - -  Phase of Treatment - Radiation - - -  Radiation Actual Start Date: - 06/27/2021 - - -  Navigator Follow Up Date: - 07/02/2021 06/18/2021 05/30/2021 05/28/2021  Navigator Follow Up Reason: - Radiology New Patient Appointment Patient Call Patient Call  Navigator Location [place]  Referral Date to RadOnc/MedOnc - - - - 05/24/2021  Navigator Encounter Type Other: Clinic/MDC;Initial MedOnc Telephone Telephone Telephone  Telephone - - Incoming Call [place] Call [place] Call  Patient Visit Type Other Initial;MedOnc - - -  Treatment Phase Pre-Tx/Tx Discussion Pre-Tx/Tx Discussion Pre-Tx/Tx Discussion Pre-Tx/Tx Discussion Pre-Tx/Tx Discussion  Barriers/Navigation Needs Coordination of Care/I followed up on patient schedule for her plan of care. She is set up at this time with rad onc and mri brain.  Education Counselling psychologist of Care Coordination of Care;Education  Education - Newly Diagnosed Cancer Education;Understanding Cancer/ Treatment Options;Other Other Other Other  Interventions Coordination of Care Education;Psycho-Social Support Coordination of Care;Psycho-Social Support;Education Coordination of Care;Education Coordination of Care;Education  Acuity Level 2-Minimal Needs (1-2 Barriers Identified) Level 3-Moderate Needs (3-4 Barriers Identified) Level 2-Minimal Needs (1-2 Barriers Identified) Level 2-Minimal Needs (1-2 Barriers Identified) Level 2-Minimal Needs (1-2 Barriers Identified)  Coordination of Care Other - Appts Other Other  Education Method - Verbal;Written Verbal Verbal Verbal  Time  Spent with Patient [phone]

## 2021-06-19 NOTE — Progress Notes (Signed)
Per Dr. Julien Nordmann, I notified pathology to send molecular and PDL 1 to [facility] on Robin Bryant's recent bx.

## 2021-06-25 ENCOUNTER — Ambulatory Visit: Payer: Medicare Other | Admitting: Radiation Oncology

## 2021-06-25 DIAGNOSIS — C3431 Malignant neoplasm of lower lobe, right bronchus or lung: Secondary | ICD-10-CM | POA: Insufficient documentation

## 2021-06-26 ENCOUNTER — Ambulatory Visit: Payer: Medicare Other

## 2021-06-27 ENCOUNTER — Other Ambulatory Visit: Payer: Self-pay

## 2021-06-27 ENCOUNTER — Ambulatory Visit
Admission: RE | Admit: 2021-06-27 | Discharge: 2021-06-27 | Disposition: A | Discharge status: Home / Self Care | Payer: Medicare Other | Source: Ambulatory Visit | Attending: Radiation Oncology | Admitting: Radiation Oncology

## 2021-06-27 DIAGNOSIS — C3431 Malignant neoplasm of lower lobe, right bronchus or lung: Secondary | ICD-10-CM | POA: Diagnosis not present

## 2021-06-28 ENCOUNTER — Encounter: Payer: Self-pay | Admitting: *Deleted

## 2021-06-28 ENCOUNTER — Ambulatory Visit
Admission: RE | Admit: 2021-06-28 | Discharge: 2021-06-28 | Disposition: A | Discharge status: Home / Self Care | Payer: Medicare Other | Source: Ambulatory Visit | Attending: Radiation Oncology | Admitting: Radiation Oncology

## 2021-06-28 ENCOUNTER — Ambulatory Visit (HOSPITAL_COMMUNITY)
Admission: RE | Admit: 2021-06-28 | Discharge: 2021-06-28 | Disposition: A | Discharge status: Home / Self Care | Payer: Medicare Other | Source: Ambulatory Visit | Attending: Internal Medicine | Admitting: Internal Medicine

## 2021-06-28 DIAGNOSIS — C3490 Malignant neoplasm of unspecified part of unspecified bronchus or lung: Secondary | ICD-10-CM | POA: Diagnosis present

## 2021-06-28 DIAGNOSIS — C3431 Malignant neoplasm of lower lobe, right bronchus or lung: Secondary | ICD-10-CM

## 2021-06-28 MED ORDER — GADOBUTROL 1 MMOL/ML IV SOLN
5.0000 mL | Freq: Once | INTRAVENOUS | Status: AC | PRN
  Administered 2021-06-28: 5 mL via INTRAVENOUS

## 2021-06-28 NOTE — Progress Notes (Signed)
Oncology Nurse Navigator Documentation  Oncology Nurse Navigator Flowsheets 06/28/2021 06/19/2021 06/19/2021 05/28/2021 05/28/2021 05/25/2021  Abnormal Finding Date - - 03/05/2021 - - -  Confirmed Diagnosis Date - - 05/15/2021 - - -  Diagnosis Status - - Pending Molecular Studies - - -  Planned Course of Treatment - - Radiation - - -  Phase of Treatment - - Radiation - - -  Radiation Actual Start Date: - - 06/27/2021 - - -  Navigator Follow Up Date: - - 07/02/2021 06/18/2021 05/30/2021 05/28/2021  Navigator Follow Up Reason: - - Radiology New Patient Appointment Patient Call Patient Call  Navigator Location [place]  Referral Date to RadOnc/MedOnc - - - - - 05/24/2021  Navigator Encounter Type Telephone Other: Clinic/MDC;Initial MedOnc Telephone Telephone Telephone  Telephone Outgoing Call - - Incoming Call [place] Call [place] Call  Patient Visit Type Other Other Initial;MedOnc - - -  Treatment Phase Pre-Tx/Tx Discussion Pre-Tx/Tx Discussion Pre-Tx/Tx Discussion Pre-Tx/Tx Discussion Pre-Tx/Tx Discussion Pre-Tx/Tx Discussion  Barriers/Navigation Needs Coordination of Care/I followed up on Robin Bryant's FO results. This test is on hold. I called FO and was told they need to talk to the patient. They did call today but were unable to reach. I will call  her later with an update.  Coordination of Care Education Coordination of Care;Education Coordination of Care Coordination of Care;Education  Education - - Newly Diagnosed Cancer Education;Understanding Cancer/ Treatment Options;Other Other Other Other  Interventions Coordination of Care Coordination of Care Education;Psycho-Social Support Coordination of Care;Psycho-Social Support;Education Coordination of Care;Education Coordination of Care;Education  Acuity Level 2-Minimal Needs (1-2 Barriers Identified) Level 2-Minimal Needs (1-2 Barriers Identified) Level 3-Moderate Needs  (3-4 Barriers Identified) Level 2-Minimal Needs (1-2 Barriers Identified) Level 2-Minimal Needs (1-2 Barriers Identified) Level 2-Minimal Needs (1-2 Barriers Identified)  Coordination of Care Other Other - Appts Other Other  Education Method - - Verbal;Written Verbal Verbal Verbal  Time Spent with Patient [phone]

## 2021-06-29 ENCOUNTER — Other Ambulatory Visit: Payer: Self-pay

## 2021-06-29 ENCOUNTER — Ambulatory Visit
Admission: RE | Admit: 2021-06-29 | Discharge: 2021-06-29 | Disposition: A | Discharge status: Home / Self Care | Payer: Medicare Other | Source: Ambulatory Visit | Attending: Radiation Oncology | Admitting: Radiation Oncology

## 2021-06-29 DIAGNOSIS — C3431 Malignant neoplasm of lower lobe, right bronchus or lung: Secondary | ICD-10-CM | POA: Diagnosis not present

## 2021-07-02 ENCOUNTER — Other Ambulatory Visit: Payer: Self-pay

## 2021-07-02 ENCOUNTER — Ambulatory Visit
Admission: RE | Admit: 2021-07-02 | Discharge: 2021-07-02 | Disposition: A | Discharge status: Home / Self Care | Payer: Medicare Other | Source: Ambulatory Visit | Attending: Radiation Oncology | Admitting: Radiation Oncology

## 2021-07-02 DIAGNOSIS — C3431 Malignant neoplasm of lower lobe, right bronchus or lung: Secondary | ICD-10-CM | POA: Diagnosis not present

## 2021-07-03 ENCOUNTER — Ambulatory Visit
Admission: RE | Admit: 2021-07-03 | Discharge: 2021-07-03 | Disposition: A | Discharge status: Home / Self Care | Payer: Medicare Other | Source: Ambulatory Visit | Attending: Radiation Oncology | Admitting: Radiation Oncology

## 2021-07-03 ENCOUNTER — Other Ambulatory Visit: Payer: Self-pay | Admitting: Radiation Oncology

## 2021-07-03 DIAGNOSIS — C3431 Malignant neoplasm of lower lobe, right bronchus or lung: Secondary | ICD-10-CM | POA: Diagnosis not present

## 2021-07-03 MED ORDER — MORPHINE SULFATE ER 15 MG PO TBCR: 15.0000 mg | EXTENDED_RELEASE_TABLET | Freq: Two times a day (BID) | ORAL | 0 refills | Status: DC

## 2021-07-04 ENCOUNTER — Ambulatory Visit
Admission: RE | Admit: 2021-07-04 | Discharge: 2021-07-04 | Disposition: A | Discharge status: Home / Self Care | Payer: Medicare Other | Source: Ambulatory Visit | Attending: Radiation Oncology | Admitting: Radiation Oncology

## 2021-07-04 ENCOUNTER — Other Ambulatory Visit: Payer: Self-pay

## 2021-07-04 DIAGNOSIS — C3431 Malignant neoplasm of lower lobe, right bronchus or lung: Secondary | ICD-10-CM | POA: Diagnosis not present

## 2021-07-05 ENCOUNTER — Ambulatory Visit
Admission: RE | Admit: 2021-07-05 | Discharge: 2021-07-05 | Disposition: A | Discharge status: Home / Self Care | Payer: Medicare Other | Source: Ambulatory Visit | Attending: Radiation Oncology | Admitting: Radiation Oncology

## 2021-07-05 DIAGNOSIS — C3431 Malignant neoplasm of lower lobe, right bronchus or lung: Secondary | ICD-10-CM | POA: Diagnosis not present

## 2021-07-05 LAB — ACID FAST CULTURE WITH REFLEXED SENSITIVITIES (MYCOBACTERIA)
Acid Fast Culture: NEGATIVE
Acid Fast Culture: NEGATIVE

## 2021-07-06 ENCOUNTER — Encounter: Payer: Self-pay | Admitting: *Deleted

## 2021-07-06 ENCOUNTER — Ambulatory Visit
Admission: RE | Admit: 2021-07-06 | Discharge: 2021-07-06 | Disposition: A | Discharge status: Home / Self Care | Payer: Medicare Other | Source: Ambulatory Visit | Attending: Radiation Oncology | Admitting: Radiation Oncology

## 2021-07-06 DIAGNOSIS — C3431 Malignant neoplasm of lower lobe, right bronchus or lung: Secondary | ICD-10-CM | POA: Diagnosis not present

## 2021-07-06 NOTE — Progress Notes (Signed)
Oncology Nurse Navigator Documentation  Oncology Nurse Navigator Flowsheets 07/06/2021 06/28/2021 06/19/2021 06/19/2021 05/28/2021 05/28/2021 05/25/2021  Abnormal Finding Date - - - 03/05/2021 - - -  Confirmed Diagnosis Date - - - 05/15/2021 - - -  Diagnosis Status - - - Pending Molecular Studies - - -  Planned Course of Treatment - - - Radiation - - -  Phase of Treatment - - - Radiation - - -  Radiation Actual Start Date: - - - 06/27/2021 - - -  Navigator Follow Up Date: 10/12/2021 - - 07/02/2021 06/18/2021 05/30/2021 05/28/2021  Navigator Follow Up Reason: Follow-up Appointment - - Radiology New Patient Appointment Patient Call Patient Call  Navigator Location [place]  Referral Date to RadOnc/MedOnc - - - - - - 05/24/2021  Navigator Encounter Type Treatment Telephone Other: Clinic/MDC;Initial MedOnc Telephone Telephone Telephone  Telephone - Outgoing Call - - Incoming Call [place] Call Outgoing Call  Treatment Initiated Date 06/27/2021 - - - - - -  Patient Visit Type RadOnc Other Other Initial;MedOnc - - -  Treatment Phase Treatment Pre-Tx/Tx Discussion Pre-Tx/Tx Discussion Pre-Tx/Tx Discussion Pre-Tx/Tx Discussion Pre-Tx/Tx Discussion Pre-Tx/Tx Discussion  Barriers/Navigation Needs Coordination of Care/I spoke to Ms. Luczynski today before her rad onc tx. We spoke about FO and they called her to tell her she would have to pay 3500 dollars for test. She did not want to sign the ABN for the testing to be completed.  I called FO and they still need her to sign this form. I explained that she did not want to do that and to cancel the test. I will update Dr. Julien Nordmann to get another plan for her.  Coordination of Care Coordination of Care Education Coordination of Care;Education Coordination of Care Coordination of Care;Education  Education - - - Newly Diagnosed Cancer Education;Understanding Cancer/ Treatment  Options;Other Other Other Other  Interventions Coordination of Care;Psycho-Social Support Coordination of Care Coordination of Care Education;Psycho-Social Support Coordination of Care;Psycho-Social Support;Education Coordination of Care;Education Coordination of Care;Education  Acuity Level 3-Moderate Needs (3-4 Barriers Identified) Level 2-Minimal Needs (1-2 Barriers Identified) Level 2-Minimal Needs (1-2 Barriers Identified) Level 3-Moderate Needs (3-4 Barriers Identified) Level 2-Minimal Needs (1-2 Barriers Identified) Level 2-Minimal Needs (1-2 Barriers Identified) Level 2-Minimal Needs (1-2 Barriers Identified)  Coordination of Care Pathology Other Other - Appts Other Other  Education Method - - - Verbal;Written Verbal Verbal Verbal  Time Spent with Patient [phone]

## 2021-07-09 ENCOUNTER — Other Ambulatory Visit: Payer: Self-pay

## 2021-07-09 ENCOUNTER — Ambulatory Visit
Admission: RE | Admit: 2021-07-09 | Discharge: 2021-07-09 | Disposition: A | Discharge status: Home / Self Care | Payer: Medicare Other | Source: Ambulatory Visit | Attending: Radiation Oncology | Admitting: Radiation Oncology

## 2021-07-09 DIAGNOSIS — C3431 Malignant neoplasm of lower lobe, right bronchus or lung: Secondary | ICD-10-CM | POA: Diagnosis not present

## 2021-07-10 ENCOUNTER — Ambulatory Visit
Admission: RE | Admit: 2021-07-10 | Discharge: 2021-07-10 | Disposition: A | Discharge status: Home / Self Care | Payer: Medicare Other | Source: Ambulatory Visit | Attending: Radiation Oncology | Admitting: Radiation Oncology

## 2021-07-10 ENCOUNTER — Encounter: Payer: Self-pay | Admitting: Radiation Oncology

## 2021-07-10 ENCOUNTER — Other Ambulatory Visit: Payer: Self-pay | Admitting: Radiation Oncology

## 2021-07-10 DIAGNOSIS — C3431 Malignant neoplasm of lower lobe, right bronchus or lung: Secondary | ICD-10-CM

## 2021-07-10 MED ORDER — MORPHINE SULFATE ER 15 MG PO TBCR: 15.0000 mg | EXTENDED_RELEASE_TABLET | Freq: Two times a day (BID) | ORAL | 0 refills | Status: DC

## 2021-08-08 ENCOUNTER — Encounter: Payer: Self-pay | Admitting: Radiation Oncology

## 2021-08-09 ENCOUNTER — Ambulatory Visit: Payer: Self-pay | Admitting: Radiation Oncology

## 2021-08-12 NOTE — Progress Notes (Signed)
?Radiation Oncology         [phone] ?________________________________ ? ?Name: Robin Bryant MRN: [id number]  ?Date: 08/13/2021  DOB: 1951/04/03 ? ?Follow-Up Visit Note ? ?CC: Pcp, No  Icard, Bradley L, DO ? ?  ICD-10-CM   ?1. 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  ? ? 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 ?Interval Since Last Radiation: 1 month and 6 days  ? ?Intent: Curative ? ?Radiation Treatment Dates: 06/27/2021 through 07/10/2021 ?Site Technique Total Dose (Gy) Dose per Fx (Gy) Completed Fx Beam Energies  ?Lung, Right: Lung_R IMRT 45/45 4.5 10/10 6XFFF  ?Lung, Left: Lung_L IMRT 50/50 10 5/5 6XFFF  ? ? ?Narrative:  The patient returns today for routine follow-up.  The patient tolerated radiation therapy relatively well. On the date of her final treatment, the patient reported ongoing lower back and right rib pain rated at an 08/10, fatigue, some pain with swallowing, and high blood pressure. She reported that MS Contin helped a lot with her back pain and I refilled this for her on the date of her final treatment.   ? ?Prior to starting RT, the patient met with Dr. Julien Nordmann on 06/18/21 who ordered further work-up consisting of an MRI of the brain, and foundation 1 testing. Subsequent MRI of the brain on 06/28/21 showed no evidence of metastatic disease or acute intracranial abnormalities.  ? ?Otherwise, no significant interval history since the patient was last seen.  ? ?She reports that the MS Contin turned out making her too sleepy and she has had stopped this medication.  It did relieve her pain at night so she could sleep pretty much through the night.  She denies any unusual coughing or shortness of breath.  She denies any hemoptysis at this time.  She is scheduled for evaluation with neurosurgery concerning her back problems in early April. ? ?Allergies:  is allergic to influenza vaccines and  other. ? ?Meds: ?Current Outpatient Medications  ?Medication Sig Dispense Refill  ? aspirin EC 81 MG tablet Take 81 mg by mouth every other day. Swallow whole.    ? Biotin 5000 MCG TABS Take 5,000 mcg by mouth daily.    ? Calcium Carb-Cholecalciferol (OYSTER SHELL CALCIUM W/D) 500-5 MG-MCG TABS Take 1 tablet by mouth 2 (two) times daily.    ? Cholecalciferol (VITAMIN D-3) 125 MCG (5000 UT) TABS Take 5,000 Units by mouth daily.    ? EUTHYROX 50 MCG tablet Take 50 mcg by mouth every morning.    ? fluticasone (FLONASE) 50 MCG/ACT nasal spray Place 1 spray into both nostrils daily.    ? gabapentin (NEURONTIN) 800 MG tablet Take 800 mg by mouth 4 (four) times daily.    ? HYDROcodone-acetaminophen (NORCO) 10-325 MG tablet Take 1 tablet by mouth 4 (four) times daily as needed for moderate pain or severe pain.    ? metoprolol succinate (TOPROL-XL) 25 MG 24 hr tablet Take 25 mg by mouth daily.    ? Polyethylene Glycol 400 (BLINK TEARS) 0.25 % SOLN Place 1 drop into both eyes daily.    ? tretinoin (RETIN-A) 0.05 % cream Apply topically daily.    ? Vitamin A 2400 MCG (8000 UT) CAPS Take 2,400 mg by mouth daily.    ? vitamin B-12 (CYANOCOBALAMIN) 1000 MCG tablet Take 1,000 mcg by mouth daily.    ? vitamin C (ASCORBIC ACID) 500 MG tablet  Take 500 mg by mouth daily.    ? vitamin E 1000 UNIT capsule Take 1,000 Units by mouth daily.    ? zinc gluconate 50 MG tablet Take 50 mg by mouth daily.    ? morphine (MS CONTIN) 15 MG 12 hr tablet Take 1 tablet (15 mg total) by mouth every 12 (twelve) hours. (Patient not taking: Reported on 08/13/2021) 60 tablet 0  ? ?No current facility-administered medications for this encounter.  ? ? ?Physical Findings: ?The patient is in no acute distress. Patient is alert and oriented. ? height is '5\' 9"'  (1.753 m) and weight is 95 lb (43.1 kg). Her temperature is 97.7 ?F (36.5 ?C). Her blood pressure is 187/93 (abnormal) and her pulse is 72. Her respiration is 20 and oxygen saturation is 99%. .  No  significant changes. Lungs are clear to auscultation bilaterally. Heart has regular rate and rhythm. No palpable cervical, supraclavicular, or axillary adenopathy. Abdomen soft, non-tender, normal bowel sounds.  ? ? ?Lab Findings: ?Lab Results  ?Component Value Date  ? WBC 8.1 05/15/2021  ? HGB 13.9 05/15/2021  ? HCT 42.4 05/15/2021  ? MCV 100.5 (H) 05/15/2021  ? PLT 215 05/15/2021  ? ? ?Radiographic Findings: ?No results found. ? 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 ?The patient is recovering well from the effects of radiation.   ? ?Plan: As needed follow-up in radiation oncology.  The patient will undergo repeat chest CT scan in May and then follow-up with Dr. Julien Nordmann for review.  In light of her close follow-up with medical oncology have not scheduled her for formal follow-up. ? ? ? ?____________________________________ ? ?Blair Promise, PhD, MD ? ? ?This document serves as a record of services personally performed by Gery Pray, MD. It was created on his behalf by Roney Mans, a trained medical scribe. The creation of this record is based on the scribe's personal observations and the provider's statements to them. This document has been checked and approved by the attending provider. ? ?

## 2021-08-13 ENCOUNTER — Other Ambulatory Visit: Payer: Self-pay

## 2021-08-13 ENCOUNTER — Ambulatory Visit
Admission: RE | Admit: 2021-08-13 | Discharge: 2021-08-13 | Disposition: A | Discharge status: Home / Self Care | Payer: Medicare Other | Source: Ambulatory Visit | Attending: Radiation Oncology | Admitting: Radiation Oncology

## 2021-08-13 ENCOUNTER — Encounter: Payer: Self-pay | Admitting: Radiation Oncology

## 2021-08-13 VITALS — BP 187/93 | HR 72 | Temp 97.7°F | Resp 20 | Ht 69.0 in | Wt 95.0 lb

## 2021-08-13 DIAGNOSIS — Z79899 Other long term (current) drug therapy: Secondary | ICD-10-CM | POA: Insufficient documentation

## 2021-08-13 DIAGNOSIS — Z923 Personal history of irradiation: Secondary | ICD-10-CM | POA: Insufficient documentation

## 2021-08-13 DIAGNOSIS — C3431 Malignant neoplasm of lower lobe, right bronchus or lung: Secondary | ICD-10-CM | POA: Diagnosis not present

## 2021-08-13 HISTORY — DX: Personal history of irradiation: Z92.3

## 2021-08-13 NOTE — Progress Notes (Signed)
Lakeyshia Tuckerman is here today for follow up post radiation to the lung. ? ?Lung Side: Bilateral lungs, patient completed treatment on 07/10/21 ? ?Does the patient complain of any of the following: ?Pain:9/10 back pain and headache ?Shortness of breath w/wo exertion: denies ?Cough: coughing due to pollen ?Hemoptysis: reports hemoptysis once after completing radiation treatment ?Pain with swallowing: denies ?Swallowing/choking concerns: denies since completing treatment ?Appetite: poor ?Energy Level: poor ?Post radiation skin Changes: reports skin has returned to normal following treatment ? ? ? ?Additional comments if applicable: nothing of note ? ?Vitals:  ? 08/13/21 1617  ?BP: (!) 187/93  ?Pulse: 72  ?Resp: 20  ?Temp: 97.7 ?F (36.5 ?C)  ?SpO2: 99%  ?Weight: 95 lb (43.1 kg)  ?Height: 5\' 9"  (1.753 m)  ? ? ?

## 2021-09-28 ENCOUNTER — Other Ambulatory Visit: Payer: Medicare Other

## 2021-09-28 ENCOUNTER — Ambulatory Visit: Payer: Medicare Other | Admitting: Hematology and Oncology

## 2021-10-01 ENCOUNTER — Inpatient Hospital Stay: Payer: Medicare Other | Attending: Hematology and Oncology

## 2021-10-01 ENCOUNTER — Other Ambulatory Visit: Payer: Self-pay

## 2021-10-01 ENCOUNTER — Ambulatory Visit (HOSPITAL_COMMUNITY)
Admission: RE | Admit: 2021-10-01 | Discharge: 2021-10-01 | Disposition: A | Discharge status: Home / Self Care | Payer: Medicare Other | Source: Ambulatory Visit | Attending: Internal Medicine | Admitting: Internal Medicine

## 2021-10-01 ENCOUNTER — Other Ambulatory Visit: Payer: Medicare Other

## 2021-10-01 DIAGNOSIS — Z801 Family history of malignant neoplasm of trachea, bronchus and lung: Secondary | ICD-10-CM | POA: Insufficient documentation

## 2021-10-01 DIAGNOSIS — E079 Disorder of thyroid, unspecified: Secondary | ICD-10-CM | POA: Insufficient documentation

## 2021-10-01 DIAGNOSIS — C3432 Malignant neoplasm of lower lobe, left bronchus or lung: Secondary | ICD-10-CM | POA: Diagnosis present

## 2021-10-01 DIAGNOSIS — R918 Other nonspecific abnormal finding of lung field: Secondary | ICD-10-CM | POA: Insufficient documentation

## 2021-10-01 DIAGNOSIS — C3490 Malignant neoplasm of unspecified part of unspecified bronchus or lung: Secondary | ICD-10-CM | POA: Insufficient documentation

## 2021-10-01 DIAGNOSIS — Z87891 Personal history of nicotine dependence: Secondary | ICD-10-CM | POA: Diagnosis not present

## 2021-10-01 DIAGNOSIS — Z809 Family history of malignant neoplasm, unspecified: Secondary | ICD-10-CM | POA: Diagnosis not present

## 2021-10-01 LAB — CBC WITH DIFFERENTIAL (CANCER CENTER ONLY)
Abs Immature Granulocytes: 0.03 10*3/uL (ref 0.00–0.07)
Basophils Absolute: 0.1 10*3/uL (ref 0.0–0.1)
Basophils Relative: 1 %
Eosinophils Absolute: 0.3 10*3/uL (ref 0.0–0.5)
Eosinophils Relative: 4 %
HCT: 40.3 % (ref 36.0–46.0)
Hemoglobin: 13.3 g/dL (ref 12.0–15.0)
Immature Granulocytes: 0 %
Lymphocytes Relative: 10 %
Lymphs Abs: 0.7 10*3/uL (ref 0.7–4.0)
MCH: 32 pg (ref 26.0–34.0)
MCHC: 33 g/dL (ref 30.0–36.0)
MCV: 97.1 fL (ref 80.0–100.0)
Monocytes Absolute: 0.8 10*3/uL (ref 0.1–1.0)
Monocytes Relative: 11 %
Neutro Abs: 5.2 10*3/uL (ref 1.7–7.7)
Neutrophils Relative %: 74 %
Platelet Count: 181 10*3/uL (ref 150–400)
RBC: 4.15 MIL/uL (ref 3.87–5.11)
RDW: 14 % (ref 11.5–15.5)
WBC Count: 7 10*3/uL (ref 4.0–10.5)
nRBC: 0 % (ref 0.0–0.2)

## 2021-10-01 LAB — CMP (CANCER CENTER ONLY)
ALT: 10 U/L (ref 0–44)
AST: 16 U/L (ref 15–41)
Albumin: 4 g/dL (ref 3.5–5.0)
Alkaline Phosphatase: 56 U/L (ref 38–126)
Anion gap: 3 — ABNORMAL LOW (ref 5–15)
BUN: 19 mg/dL (ref 8–23)
CO2: 32 mmol/L (ref 22–32)
Calcium: 9 mg/dL (ref 8.9–10.3)
Chloride: 107 mmol/L (ref 98–111)
Creatinine: 0.77 mg/dL (ref 0.44–1.00)
GFR, Estimated: >60 mL/min (ref >60)
Glucose, Bld: 93 mg/dL (ref 70–99)
Potassium: 4.3 mmol/L (ref 3.5–5.1)
Sodium: 142 mmol/L (ref 135–145)
Total Bilirubin: 0.4 mg/dL (ref 0.3–1.2)
Total Protein: 6.5 g/dL (ref 6.5–8.1)

## 2021-10-01 MED ORDER — SODIUM CHLORIDE (PF) 0.9 % IJ SOLN
INTRAMUSCULAR | Status: AC
  Filled 2021-10-01: qty 50

## 2021-10-01 MED ORDER — IOHEXOL 300 MG/ML  SOLN
75.0000 mL | Freq: Once | INTRAMUSCULAR | Status: AC | PRN
  Administered 2021-10-01: 75 mL via INTRAVENOUS

## 2021-10-08 ENCOUNTER — Ambulatory Visit: Payer: Medicare Other | Admitting: Internal Medicine

## 2021-10-08 ENCOUNTER — Other Ambulatory Visit: Payer: Self-pay

## 2021-10-08 ENCOUNTER — Inpatient Hospital Stay (HOSPITAL_BASED_OUTPATIENT_CLINIC_OR_DEPARTMENT_OTHER): Payer: Medicare Other | Admitting: Hematology and Oncology

## 2021-10-08 ENCOUNTER — Other Ambulatory Visit: Payer: Self-pay | Admitting: Hematology and Oncology

## 2021-10-08 VITALS — BP 188/91 | HR 87 | Temp 98.1°F | Resp 15 | Ht 69.0 in | Wt 96.2 lb

## 2021-10-08 DIAGNOSIS — C3490 Malignant neoplasm of unspecified part of unspecified bronchus or lung: Secondary | ICD-10-CM | POA: Diagnosis not present

## 2021-10-08 DIAGNOSIS — C3432 Malignant neoplasm of lower lobe, left bronchus or lung: Secondary | ICD-10-CM | POA: Diagnosis not present

## 2021-10-12 ENCOUNTER — Other Ambulatory Visit: Payer: Medicare Other

## 2021-10-12 ENCOUNTER — Encounter: Payer: Self-pay | Admitting: *Deleted

## 2021-10-12 NOTE — Progress Notes (Signed)
Oncology Nurse Navigator Documentation     10/12/2021   12:00 PM 07/06/2021    8:00 AM 06/28/2021   11:00 AM 06/19/2021    3:00 PM 06/19/2021    9:00 AM 05/28/2021    3:00 PM 05/28/2021   10:00 AM  Oncology Nurse Navigator Flowsheets  Abnormal Finding Date     03/05/2021    Confirmed Diagnosis Date     05/15/2021    Diagnosis Status     Pending Molecular Studies    Planned Course of Treatment     Radiation    Phase of Treatment     Radiation    Radiation Actual Start Date:     06/27/2021    Navigator Follow Up Date:  10/12/2021   07/02/2021 06/18/2021 05/30/2021  Navigator Follow Up Reason:  Follow-up Appointment   Radiology New Patient Appointment Patient Call  Navigation Complete Date: 10/12/2021        Post Navigation: Continue to Follow Patient? No        Reason Not Navigating Patient: No Treatment, Observation Only        Navigator Location [facility]  Navigator Encounter Type Other: Treatment Telephone Other: Clinic/MDC;Initial MedOnc Telephone Telephone  Telephone   Outgoing Call   Incoming Call Outgoing Call  Treatment Initiated Date  06/27/2021       Patient Visit Type  RadOnc Other Other Initial;MedOnc    Treatment Phase  Treatment Pre-Tx/Tx Discussion Pre-Tx/Tx Discussion Pre-Tx/Tx Discussion Pre-Tx/Tx Discussion Pre-Tx/Tx Discussion  Barriers/Navigation Needs Coordination of Care/I followed up on Dr. Clabe Seal note. Patient is on observation at this time and is set up for follow up to see Dr. Lorenso Courier in August.  [place] Education Coordination of Care;Education Coordination of Care  Education     Newly Diagnosed Cancer Education;Understanding Cancer/ Treatment Options;Other Other Other  Interventions Coordination of Care Coordination of Care;Psycho-Social Support Coordination of Care Coordination of Care Education;Psycho-Social Support  Coordination of Care;Psycho-Social Support;Education Coordination of Care;Education  Acuity Level 2-Minimal Needs (1-2 Barriers Identified) Level 3-Moderate Needs (3-4 Barriers Identified) Level 2-Minimal Needs (1-2 Barriers Identified) Level 2-Minimal Needs (1-2 Barriers Identified) Level 3-Moderate Needs (3-4 Barriers Identified) Level 2-Minimal Needs (1-2 Barriers Identified) Level 2-Minimal Needs (1-2 Barriers Identified)  Coordination of Care Other Pathology Other Other  Appts Other  Education Method     Verbal;Written Verbal Verbal  Time Spent with Patient [phone]

## 2021-10-14 NOTE — Progress Notes (Signed)
[facility] [phone]   [phone]  PROGRESS NOTE  Patient Care Team: Pcp, No as PCP - General  Hematological/Oncological History # Adenocarcinoma of the Lung, Stage Ia (T1b, N0, M0) 05/15/2021: bronchoscopy performed, biopsy consistent with adenocarcinoma of the lung.  06/18/2021: last visit with Dr. Julien Nordmann.  2/8-2/21/2023: SBRT with Dr. Sondra Come.  10/01/2021: CT Chest w contrast shows response to therapy of right lower and left upper lobe pulmonary nodules. 10/08/2021: establish care with Dr. Lorenso Courier   Interval History:  Robin Bryant 71 y.o. female with medical history significant for adenocarcinoma of the lung stage Ia who presents for a follow up visit. The patient's last visit was on 06/18/2021 with Dr. Julien Nordmann. In the interim since the last visit she had a CT scan performed on 10/01/2021 which showed response to therapy of the right lower and upper lobe pulmonary nodules.  On exam today Robin Bryant presents to establish care.  She is requesting to transfer care from Dr. Julien Nordmann who is aware of the transfer.  She reports that she is currently undergoing evaluation for thyroid nodule and underwent a sonogram.  She was told that the lesion was not aggressive and she would need follow-up in 1 years time.  This physician is in [place] closer to where the patient lives.  She reports that she has been having some trouble with pollen and does feel some "stuff in my chest".  She notes that she is not currently taking any medications for her allergies.  She denies any issues with shortness of breath or cough.  She does endorse some occasional episodes of fatigue and some trouble with her scoliosis.  She notes that the physician has requested she gained 15 to 20 pounds prior to scoliosis surgery.  She notes that she has been doing her best to take Ensure and eat more in order to gain the necessary weight.  She otherwise denies any fevers, chills, sweats,  nausea, vomiting or diarrhea.  Full 10 point ROS was otherwise negative.  MEDICAL HISTORY:  Past Medical History:  Diagnosis Date   Back pain    History of radiation therapy    Left Lung, Right Lung 06/27/21-07/10/21- Dr. Gery Pray   HTN (hypertension)    Scoliosis    Sinus congestion     SURGICAL HISTORY: Past Surgical History:  Procedure Laterality Date   ABDOMINAL HYSTERECTOMY     BACK SURGERY     BRONCHIAL BIOPSY  05/15/2021   Procedure: BRONCHIAL BIOPSIES;  Surgeon: Garner Nash, DO;  Location: [facility];  Service: Pulmonary;;   BRONCHIAL BRUSHINGS  05/15/2021   Procedure: BRONCHIAL BRUSHINGS;  Surgeon: Garner Nash, DO;  Location: [facility];  Service: Pulmonary;;   BRONCHIAL NEEDLE ASPIRATION BIOPSY  05/15/2021   Procedure: BRONCHIAL NEEDLE ASPIRATION BIOPSIES;  Surgeon: Garner Nash, DO;  Location: [facility];  Service: Pulmonary;;   BRONCHIAL WASHINGS  05/15/2021   Procedure: BRONCHIAL WASHINGS;  Surgeon: Garner Nash, DO;  Location: [facility];  Service: Pulmonary;;   VIDEO BRONCHOSCOPY WITH RADIAL ENDOBRONCHIAL ULTRASOUND  05/15/2021   Procedure: RADIAL ENDOBRONCHIAL ULTRASOUND;  Surgeon: Garner Nash, DO;  Location: [facility];  Service: Pulmonary;;    SOCIAL HISTORY: Social History   Socioeconomic History   Marital status: Divorced    Spouse name: Not on file   Number of children: Not on file   Years of education: Not on file   Highest education level: Not on file  Occupational History   Not  on file  Tobacco Use   Smoking status: Former    Packs/day: 0.50    Years: 50.00    Pack years: 25.00    Types: Cigarettes    Quit date: 04/04/2021    Years since quitting: 0.5   Smokeless tobacco: Never  Vaping Use   Vaping Use: Never used  Substance and Sexual Activity   Alcohol use: Never   Drug use: Never   Sexual activity: Not on file  Other Topics Concern   Not on file  Social History Narrative   Not on file   Social  Determinants of Health   Financial Resource Strain: Not on file  Food Insecurity: Not on file  Transportation Needs: Not on file  Physical Activity: Not on file  Stress: Not on file  Social Connections: Not on file  Intimate Partner Violence: Not on file    FAMILY HISTORY: Family History  Problem Relation Age of Onset   Alzheimer's disease Mother    Cancer Father    Lung cancer Father     ALLERGIES:  is allergic to influenza vaccines and other.  MEDICATIONS:  Current Outpatient Medications  Medication Sig Dispense Refill   aspirin EC 81 MG tablet Take 81 mg by mouth every other day. Swallow whole.     Biotin 5000 MCG TABS Take 5,000 mcg by mouth daily.     Calcium Carb-Cholecalciferol (OYSTER SHELL CALCIUM W/D) 500-5 MG-MCG TABS Take 1 tablet by mouth 2 (two) times daily.     Cholecalciferol (VITAMIN D-3) 125 MCG (5000 UT) TABS Take 5,000 Units by mouth daily.     EUTHYROX 50 MCG tablet Take 50 mcg by mouth every morning.     fluticasone (FLONASE) 50 MCG/ACT nasal spray Place 1 spray into both nostrils daily.     gabapentin (NEURONTIN) 800 MG tablet Take 800 mg by mouth 4 (four) times daily.     HYDROcodone-acetaminophen (NORCO) 10-325 MG tablet Take 1 tablet by mouth 4 (four) times daily as needed for moderate pain or severe pain.     metoprolol succinate (TOPROL-XL) 25 MG 24 hr tablet Take 25 mg by mouth daily.     morphine (MS CONTIN) 15 MG 12 hr tablet Take 1 tablet (15 mg total) by mouth every 12 (twelve) hours. (Patient not taking: Reported on 08/13/2021) 60 tablet 0   Polyethylene Glycol 400 (BLINK TEARS) 0.25 % SOLN Place 1 drop into both eyes daily.     tretinoin (RETIN-A) 0.05 % cream Apply topically daily.     Vitamin A 2400 MCG (8000 UT) CAPS Take 2,400 mg by mouth daily.     vitamin B-12 (CYANOCOBALAMIN) 1000 MCG tablet Take 1,000 mcg by mouth daily.     vitamin C (ASCORBIC ACID) 500 MG tablet Take 500 mg by mouth daily.     vitamin E 1000 UNIT capsule Take 1,000  Units by mouth daily.     zinc gluconate 50 MG tablet Take 50 mg by mouth daily.     No current facility-administered medications for this visit.    REVIEW OF SYSTEMS:   Constitutional: ( - ) fevers, ( - )  chills , ( - ) night sweats Eyes: ( - ) blurriness of vision, ( - ) double vision, ( - ) watery eyes Ears, nose, mouth, throat, and face: ( - ) mucositis, ( - ) sore throat Respiratory: ( - ) cough, ( - ) dyspnea, ( - ) wheezes Cardiovascular: ( - ) palpitation, ( - ) chest discomfort, ( - )  lower extremity swelling Gastrointestinal:  ( - ) nausea, ( - ) heartburn, ( - ) change in bowel habits Skin: ( - ) abnormal skin rashes Lymphatics: ( - ) new lymphadenopathy, ( - ) easy bruising Neurological: ( - ) numbness, ( - ) tingling, ( - ) new weaknesses Behavioral/Psych: ( - ) mood change, ( - ) new changes  All other systems were reviewed with the patient and are negative.  PHYSICAL EXAMINATION: ECOG PERFORMANCE STATUS: 1 - Symptomatic but completely ambulatory  Vitals:   10/08/21 1019  BP: (!) 188/91  Pulse: 87  Resp: 15  Temp: 98.1 F (36.7 C)  SpO2: 94%   Filed Weights   10/08/21 1019  Weight: 96 lb 3.2 oz (43.6 kg)    GENERAL: Well-appearing middle-age Caucasian female.  Quite thin.  Alert, no distress and comfortable SKIN: skin color, texture, turgor are normal, no rashes or significant lesions EYES: conjunctiva are pink and non-injected, sclera clear LUNGS: clear to auscultation and percussion with normal breathing effort HEART: regular rate & rhythm and no murmurs and no lower extremity edema Musculoskeletal: no cyanosis of digits and no clubbing  PSYCH: alert & oriented x 3, fluent speech NEURO: no focal motor/sensory deficits  LABORATORY DATA:  I have reviewed the data as listed    Latest Ref Rng & Units 10/01/2021   10:04 AM 05/15/2021    7:29 AM  CBC  WBC 4.0 - 10.5 K/uL 7.0   8.1    Hemoglobin 12.0 - 15.0 g/dL 13.3   13.9    Hematocrit 36.0 - 46.0 %  40.3   42.4    Platelets 150 - 400 K/uL 181   215         Latest Ref Rng & Units 10/01/2021   10:04 AM 05/15/2021    7:29 AM  CMP  Glucose 70 - 99 mg/dL 93   84    BUN 8 - 23 mg/dL 19   15    Creatinine 0.44 - 1.00 mg/dL 0.77   0.69    Sodium 135 - 145 mmol/L 142   143    Potassium 3.5 - 5.1 mmol/L 4.3   3.5    Chloride 98 - 111 mmol/L 107   107    CO2 22 - 32 mmol/L 32   29    Calcium 8.9 - 10.3 mg/dL 9.0   8.8    Total Protein 6.5 - 8.1 g/dL 6.5     Total Bilirubin 0.3 - 1.2 mg/dL 0.4     Alkaline Phos 38 - 126 U/L 56     AST 15 - 41 U/L 16     ALT 0 - 44 U/L 10       RADIOGRAPHIC STUDIES: I have personally reviewed the radiological images as listed and agreed with the findings in the report: response to radiation therapy with improved nodules.   CT Chest W Contrast  Result Date: 10/02/2021 CLINICAL DATA:  Primary Cancer Type: Lung Imaging Indication: Assess response to therapy Interval therapy since last imaging? Yes Initial Cancer Diagnosis Date: 05/15/2021; Established by: Biopsy-proven Detailed Pathology: Stage Ia non-small cell lung cancer, adenocarcinoma. Primary Tumor location: Right lower lobe, left upper lobe. Surgeries: No thoracic. Chemotherapy: No Immunotherapy? No Radiation therapy? Yes; Date Range: 06/27/2021 - 07/10/2021; Target: Right and left lung * Tracking Code: BO * EXAM: CT CHEST WITH CONTRAST TECHNIQUE: Multidetector CT imaging of the chest was performed during intravenous contrast administration. RADIATION DOSE REDUCTION: This exam was performed according to  the departmental dose-optimization program which includes automated exposure control, adjustment of the mA and/or kV according to patient size and/or use of iterative reconstruction technique. CONTRAST:  40mL OMNIPAQUE IOHEXOL 300 MG/ML  SOLN COMPARISON:  Most recent CT chest 05/11/2021.  04/16/2021 PET-CT. FINDINGS: Cardiovascular: Aortic atherosclerosis. Normal heart size, without pericardial effusion. No  central pulmonary embolism, on this non-dedicated study. Mediastinum/Nodes: 1.6 cm left-sided thyroid nodule. No mediastinal or hilar adenopathy. Upper esophageal dilatation with air-fluid level within on 44/2. Lungs/Pleura: Small right pleural effusion, minimally increased. Mild centrilobular emphysema. Biapical pleuroparenchymal scarring. Chronic atypical infection, with areas of lingular/right middle lobe bronchiectasis and mucoid impaction. Medial right lower lobe nodule of interest measures 1.6 x 1.3 cm on 101/5 versus 2.3 x 1.7 cm on the prior diagnostic CT. Anterior left upper lobe irregular pulmonary nodule measures 8 x 8 mm on 73/5 versus 13 x 10 mm on the prior exam. probable mucoid impaction within the more lateral and inferior left upper lobe at 8 mm on 84/5, similar. Upper Abdomen: Normal imaged portions of the liver, spleen, stomach, pancreas, adrenal glands, kidneys. Musculoskeletal: Osteopenia. Moderate to marked convex left thoracolumbar spine curvature. IMPRESSION: 1. Response to therapy of right lower and left upper lobe pulmonary nodules. 2. 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 3. Slight increase in small right pleural effusion. 4. No thoracic adenopathy. 5. Aortic atherosclerosis (ICD10-I70.0) and emphysema (ICD10-J43.9). 6. 1.6 cm left thyroid nodule. Recommend thyroid US (ref: J Am Coll Radiol. 2015 Feb;12(2): 143-50). 7. Esophageal air fluid level suggests dysmotility or gastroesophageal reflux. Electronically Signed   By: Abigail Miyamoto M.D.   On: 10/02/2021 10:57    ASSESSMENT & PLAN Robin Bryant 71 y.o. female with medical history significant for adenocarcinoma of the lung stage Ia who presents for a follow up visit.  Regarding her prior history, per Dr. Worthy Flank Note: " CT scan of the chest on March 05, 2021 and it showed bilateral pulmonary nodules  measuring up to 1.8 x 2.3 cm in the medial right lower lobe.  There was also subpleural scarring in the peripheral left lower lobe.  A PET scan was performed on April 16, 2021 and that showed no thoracic nodal hypermetabolism.  A left upper lobe pulmonary nodule measured 0.8 cm with SUV of 2.6.  More anterior left upper lobe pulmonary nodule measured 1.0 cm with SUV of 4.6.  A lingular nodule measuring 0.6 cm with SUV of 1.6.  There was also a pleural-based right lower lobe dominant irregular pulmonary nodule measuring 1.8 x 1.6 with SUV max of 9.0.  There was no evidence for extrathoracic hypermetabolic metastasis.  The patient was referred to Dr. Valeta Harms and CT super D of the chest was performed on May 11, 2021 for preparation of the navigational bronchoscopy.  On May 15, 2021 the patient underwent video bronchoscopy with robotic assisted bronchoscopic navigation under the care of Dr. Valeta Harms.  The final cytology ([id number]) of the right lower lobe brushing and fine-needle aspiration showed malignant cells consistent with adenocarcinoma.  The fine-needle aspiration as well as the brushing of the left upper lobe lung nodules showed atypical cells present. The patient was referred to Dr. Sondra Come for consideration of SBRT.  "  # Adenocarcinoma of the Lung, Stage Ia (T1b, N0, M0) -- At this time it appears as though the patient has had a complete response to radiation therapy -- Recommend repeat CT scan of the chest in 3 months time  with a return to clinic --Encouraged continued weight gain -- Return to clinic in 3 months time  #Thyroid Lesion -- Currently under evaluation by a physician in [place].  The patient reports that she would like her follow-up regarding this lesion with them.  Orders Placed This Encounter  Procedures   CT CHEST ABDOMEN PELVIS W CONTRAST    Standing Status:   Future    Standing Expiration Date:   10/16/2022    Order Specific Question:   Preferred imaging  location?    Answer:   [facility]    Order Specific Question:   Is Oral Contrast requested for this exam?    Answer:   Yes, Per Radiology protocol    All questions were answered. The patient knows to call the clinic with any problems, questions or concerns.  A total of more than 40 minutes were spent on this encounter with face-to-face time and non-face-to-face time, including preparing to see the patient, ordering tests and/or medications, counseling the patient and coordination of care as outlined above.   Ledell Peoples, MD Department of Hematology/Oncology [facility] Phone: [phone] Pager: [phone] Email: [email or web address]  10/15/2021 7:30 PM

## 2021-10-16 ENCOUNTER — Ambulatory Visit: Payer: Medicare Other | Admitting: Internal Medicine

## 2021-12-24 ENCOUNTER — Other Ambulatory Visit: Payer: Medicare Other

## 2021-12-24 ENCOUNTER — Inpatient Hospital Stay: Payer: Medicare Other | Attending: Hematology and Oncology | Admitting: Hematology and Oncology

## 2021-12-24 ENCOUNTER — Inpatient Hospital Stay: Payer: Medicare Other

## 2021-12-24 ENCOUNTER — Other Ambulatory Visit: Payer: Self-pay

## 2021-12-24 ENCOUNTER — Ambulatory Visit (HOSPITAL_COMMUNITY)
Admission: RE | Admit: 2021-12-24 | Discharge: 2021-12-24 | Disposition: A | Discharge status: Home / Self Care | Payer: Medicare Other | Source: Ambulatory Visit | Attending: Hematology and Oncology | Admitting: Hematology and Oncology

## 2021-12-24 VITALS — BP 198/107 | HR 79 | Temp 98.2°F | Resp 16 | Wt 94.9 lb

## 2021-12-24 DIAGNOSIS — M549 Dorsalgia, unspecified: Secondary | ICD-10-CM | POA: Insufficient documentation

## 2021-12-24 DIAGNOSIS — J90 Pleural effusion, not elsewhere classified: Secondary | ICD-10-CM | POA: Diagnosis not present

## 2021-12-24 DIAGNOSIS — E041 Nontoxic single thyroid nodule: Secondary | ICD-10-CM | POA: Diagnosis not present

## 2021-12-24 DIAGNOSIS — M25559 Pain in unspecified hip: Secondary | ICD-10-CM | POA: Insufficient documentation

## 2021-12-24 DIAGNOSIS — G8929 Other chronic pain: Secondary | ICD-10-CM | POA: Insufficient documentation

## 2021-12-24 DIAGNOSIS — Z87891 Personal history of nicotine dependence: Secondary | ICD-10-CM | POA: Diagnosis not present

## 2021-12-24 DIAGNOSIS — Z85118 Personal history of other malignant neoplasm of bronchus and lung: Secondary | ICD-10-CM | POA: Insufficient documentation

## 2021-12-24 DIAGNOSIS — R0781 Pleurodynia: Secondary | ICD-10-CM | POA: Diagnosis not present

## 2021-12-24 DIAGNOSIS — C3490 Malignant neoplasm of unspecified part of unspecified bronchus or lung: Secondary | ICD-10-CM

## 2021-12-24 DIAGNOSIS — Z801 Family history of malignant neoplasm of trachea, bronchus and lung: Secondary | ICD-10-CM | POA: Diagnosis not present

## 2021-12-24 LAB — CBC WITH DIFFERENTIAL (CANCER CENTER ONLY)
Abs Immature Granulocytes: 0.05 10*3/uL (ref 0.00–0.07)
Basophils Absolute: 0.1 10*3/uL (ref 0.0–0.1)
Basophils Relative: 1 %
Eosinophils Absolute: 0.3 10*3/uL (ref 0.0–0.5)
Eosinophils Relative: 3 %
HCT: 45 % (ref 36.0–46.0)
Hemoglobin: 15 g/dL (ref 12.0–15.0)
Immature Granulocytes: 1 %
Lymphocytes Relative: 9 %
Lymphs Abs: 0.8 10*3/uL (ref 0.7–4.0)
MCH: 32.5 pg (ref 26.0–34.0)
MCHC: 33.3 g/dL (ref 30.0–36.0)
MCV: 97.4 fL (ref 80.0–100.0)
Monocytes Absolute: 0.9 10*3/uL (ref 0.1–1.0)
Monocytes Relative: 11 %
Neutro Abs: 6.6 10*3/uL (ref 1.7–7.7)
Neutrophils Relative %: 75 %
Platelet Count: 213 10*3/uL (ref 150–400)
RBC: 4.62 MIL/uL (ref 3.87–5.11)
RDW: 13.6 % (ref 11.5–15.5)
WBC Count: 8.7 10*3/uL (ref 4.0–10.5)
nRBC: 0 % (ref 0.0–0.2)

## 2021-12-24 LAB — CMP (CANCER CENTER ONLY)
ALT: 13 U/L (ref 0–44)
AST: 21 U/L (ref 15–41)
Albumin: 4.2 g/dL (ref 3.5–5.0)
Alkaline Phosphatase: 68 U/L (ref 38–126)
Anion gap: 7 (ref 5–15)
BUN: 17 mg/dL (ref 8–23)
CO2: 34 mmol/L — ABNORMAL HIGH (ref 22–32)
Calcium: 9.7 mg/dL (ref 8.9–10.3)
Chloride: 102 mmol/L (ref 98–111)
Creatinine: 0.93 mg/dL (ref 0.44–1.00)
GFR, Estimated: >60 mL/min (ref >60)
Glucose, Bld: 88 mg/dL (ref 70–99)
Potassium: 3.8 mmol/L (ref 3.5–5.1)
Sodium: 143 mmol/L (ref 135–145)
Total Bilirubin: 0.4 mg/dL (ref 0.3–1.2)
Total Protein: 7.2 g/dL (ref 6.5–8.1)

## 2021-12-24 MED ORDER — IOHEXOL 300 MG/ML  SOLN
100.0000 mL | Freq: Once | INTRAMUSCULAR | Status: AC | PRN
  Administered 2021-12-24: 100 mL via INTRAVENOUS

## 2021-12-24 MED ORDER — SODIUM CHLORIDE (PF) 0.9 % IJ SOLN
INTRAMUSCULAR | Status: AC
  Filled 2021-12-24: qty 50

## 2021-12-24 MED ORDER — AMLODIPINE BESYLATE 5 MG PO TABS: 10.0000 mg | ORAL_TABLET | Freq: Every day | ORAL | 1 refills | Status: DC

## 2021-12-24 NOTE — Progress Notes (Signed)
[place] [phone]   [phone]  PROGRESS NOTE  Patient Care Team: [name], NP as PCP - General  Hematological/Oncological History # Adenocarcinoma of the Lung, Stage Ia (T1b, N0, M0) 05/15/2021: bronchoscopy performed, biopsy consistent with adenocarcinoma of the lung.  06/18/2021: last visit with Dr. Julien Nordmann.  2/8-2/21/2023: SBRT with Dr. Sondra Come.  10/01/2021: CT Chest w contrast shows response to therapy of right lower and left upper lobe pulmonary nodules. 10/08/2021: establish care with Dr. Lorenso Courier  12/24/2021: CT C/A/P performed. Preliminary read shows increased pleural effusion but no evidence of recurrence.   Interval History:  Robin Bryant 71 y.o. female with medical history significant for adenocarcinoma of the lung stage Ia who presents for a follow up visit. The patient's last visit was on 10/08/2021. In the interim since the last visit she had a CT scan performed on 12/24/2021 which showed increase in pleural effusion but no evidence of recurrent disease.   On exam today Mrs. Bruster reports she does have her chronic back pain, hip pain, and rib pain.  Mostly is due to the curvature of her spine and the pressure points on the right side of her abdomen.  She reports he take her hydrocodone but it "does not help much".  She notes that she has not been having any issues with shortness of breath or cough.  She notes that she did have some cough when the smoke came in from [place].  She reports her energy is currently 8 out of 10.  She notes that she is not having any difficulty with appetite and her weight has been steady.  Her blood pressure is quite high today.  But she is not having any headache or vision changes.  She otherwise denies any fevers, chills, sweats, nausea, vomiting or diarrhea.  Full 10 point ROS was otherwise negative.  MEDICAL HISTORY:  Past Medical History:  Diagnosis Date   Back pain    History of radiation  therapy    Left Lung, Right Lung 06/27/21-07/10/21- Dr. Gery Pray   HTN (hypertension)    Scoliosis    Sinus congestion     SURGICAL HISTORY: Past Surgical History:  Procedure Laterality Date   ABDOMINAL HYSTERECTOMY     BACK SURGERY     BRONCHIAL BIOPSY  05/15/2021   Procedure: BRONCHIAL BIOPSIES;  Surgeon: Garner Nash, DO;  Location: [facility];  Service: Pulmonary;;   BRONCHIAL BRUSHINGS  05/15/2021   Procedure: BRONCHIAL BRUSHINGS;  Surgeon: Garner Nash, DO;  Location: [facility];  Service: Pulmonary;;   BRONCHIAL NEEDLE ASPIRATION BIOPSY  05/15/2021   Procedure: BRONCHIAL NEEDLE ASPIRATION BIOPSIES;  Surgeon: Garner Nash, DO;  Location: [facility];  Service: Pulmonary;;   BRONCHIAL WASHINGS  05/15/2021   Procedure: BRONCHIAL WASHINGS;  Surgeon: Garner Nash, DO;  Location: [facility];  Service: Pulmonary;;   VIDEO BRONCHOSCOPY WITH RADIAL ENDOBRONCHIAL ULTRASOUND  05/15/2021   Procedure: RADIAL ENDOBRONCHIAL ULTRASOUND;  Surgeon: Garner Nash, DO;  Location: [facility];  Service: Pulmonary;;    SOCIAL HISTORY: Social History   Socioeconomic History   Marital status: Divorced    Spouse name: Not on file   Number of children: Not on file   Years of education: Not on file   Highest education level: Not on file  Occupational History   Not on file  Tobacco Use   Smoking status: Former    Packs/day: 0.50    Years: 50.00    Total pack years: 25.00  Types: Cigarettes    Quit date: 04/04/2021    Years since quitting: 0.7   Smokeless tobacco: Never  Vaping Use   Vaping Use: Never used  Substance and Sexual Activity   Alcohol use: Never   Drug use: Never   Sexual activity: Not on file  Other Topics Concern   Not on file  Social History Narrative   Not on file   Social Determinants of Health   Financial Resource Strain: Not on file  Food Insecurity: Not on file  Transportation Needs: Not on file  Physical Activity: Not on file   Stress: Not on file  Social Connections: Not on file  Intimate Partner Violence: Not on file    FAMILY HISTORY: Family History  Problem Relation Age of Onset   Alzheimer's disease Mother    Cancer Father    Lung cancer Father     ALLERGIES:  is allergic to influenza vaccines and other.  MEDICATIONS:  Current Outpatient Medications  Medication Sig Dispense Refill   amLODipine (NORVASC) 5 MG tablet Take 2 tablets (10 mg total) by mouth daily. 60 tablet 1   aspirin EC 81 MG tablet Take 81 mg by mouth every other day. Swallow whole.     Biotin 5000 MCG TABS Take 5,000 mcg by mouth daily.     Calcium Carb-Cholecalciferol (OYSTER SHELL CALCIUM W/D) 500-5 MG-MCG TABS Take 1 tablet by mouth 2 (two) times daily.     Cholecalciferol (VITAMIN D-3) 125 MCG (5000 UT) TABS Take 5,000 Units by mouth daily.     EUTHYROX 50 MCG tablet Take 50 mcg by mouth every morning.     fluticasone (FLONASE) 50 MCG/ACT nasal spray Place 1 spray into both nostrils daily.     gabapentin (NEURONTIN) 800 MG tablet Take 800 mg by mouth 4 (four) times daily.     HYDROcodone-acetaminophen (NORCO) 10-325 MG tablet Take 1 tablet by mouth 4 (four) times daily as needed for moderate pain or severe pain.     metoprolol succinate (TOPROL-XL) 25 MG 24 hr tablet Take 25 mg by mouth daily.     morphine (MS CONTIN) 15 MG 12 hr tablet Take 1 tablet (15 mg total) by mouth every 12 (twelve) hours. (Patient not taking: Reported on 08/13/2021) 60 tablet 0   Polyethylene Glycol 400 (BLINK TEARS) 0.25 % SOLN Place 1 drop into both eyes daily.     tretinoin (RETIN-A) 0.05 % cream Apply topically daily.     Vitamin A 2400 MCG (8000 UT) CAPS Take 2,400 mg by mouth daily.     vitamin B-12 (CYANOCOBALAMIN) 1000 MCG tablet Take 1,000 mcg by mouth daily.     vitamin C (ASCORBIC ACID) 500 MG tablet Take 500 mg by mouth daily.     vitamin E 1000 UNIT capsule Take 1,000 Units by mouth daily.     zinc gluconate 50 MG tablet Take 50 mg by mouth  daily.     No current facility-administered medications for this visit.   Facility-Administered Medications Ordered in Other Visits  Medication Dose Route Frequency Provider Last Rate Last Admin   sodium chloride (PF) 0.9 % injection             REVIEW OF SYSTEMS:   Constitutional: ( - ) fevers, ( - )  chills , ( - ) night sweats Eyes: ( - ) blurriness of vision, ( - ) double vision, ( - ) watery eyes Ears, nose, mouth, throat, and face: ( - ) mucositis, ( - )  sore throat Respiratory: ( - ) cough, ( - ) dyspnea, ( - ) wheezes Cardiovascular: ( - ) palpitation, ( - ) chest discomfort, ( - ) lower extremity swelling Gastrointestinal:  ( - ) nausea, ( - ) heartburn, ( - ) change in bowel habits Skin: ( - ) abnormal skin rashes Lymphatics: ( - ) new lymphadenopathy, ( - ) easy bruising Neurological: ( - ) numbness, ( - ) tingling, ( - ) new weaknesses Behavioral/Psych: ( - ) mood change, ( - ) new changes  All other systems were reviewed with the patient and are negative.  PHYSICAL EXAMINATION: ECOG PERFORMANCE STATUS: 1 - Symptomatic but completely ambulatory  Vitals:   12/24/21 1124  BP: (!) 198/107  Pulse: 79  Resp: 16  Temp: 98.2 F (36.8 C)  SpO2: 94%   Filed Weights   12/24/21 1124  Weight: 94 lb 14.4 oz (43 kg)    GENERAL: Well-appearing middle-age Caucasian female.  Quite thin.  Alert, no distress and comfortable SKIN: skin color, texture, turgor are normal, no rashes or significant lesions EYES: conjunctiva are pink and non-injected, sclera clear LUNGS: clear to auscultation and percussion with normal breathing effort HEART: regular rate & rhythm and no murmurs and no lower extremity edema Musculoskeletal: no cyanosis of digits and no clubbing  PSYCH: alert & oriented x 3, fluent speech NEURO: no focal motor/sensory deficits  LABORATORY DATA:  I have reviewed the data as listed    Latest Ref Rng & Units 12/24/2021    7:43 AM 10/01/2021   10:04 AM 05/15/2021     7:29 AM  CBC  WBC 4.0 - 10.5 K/uL 8.7  7.0  8.1   Hemoglobin 12.0 - 15.0 g/dL 15.0  13.3  13.9   Hematocrit 36.0 - 46.0 % 45.0  40.3  42.4   Platelets 150 - 400 K/uL 213  181  215        Latest Ref Rng & Units 12/24/2021    7:43 AM 10/01/2021   10:04 AM 05/15/2021    7:29 AM  CMP  Glucose 70 - 99 mg/dL 88  93  84   BUN 8 - 23 mg/dL 17  19  15    Creatinine 0.44 - 1.00 mg/dL 0.93  0.77  0.69   Sodium 135 - 145 mmol/L 143  142  143   Potassium 3.5 - 5.1 mmol/L 3.8  4.3  3.5   Chloride 98 - 111 mmol/L 102  107  107   CO2 22 - 32 mmol/L 34  32  29   Calcium 8.9 - 10.3 mg/dL 9.7  9.0  8.8   Total Protein 6.5 - 8.1 g/dL 7.2  6.5    Total Bilirubin 0.3 - 1.2 mg/dL 0.4  0.4    Alkaline Phos 38 - 126 U/L 68  56    AST 15 - 41 U/L 21  16    ALT 0 - 44 U/L 13  10      RADIOGRAPHIC STUDIES: I have personally reviewed the radiological images as listed and agreed with the findings in the report: response to radiation therapy with improved nodules.   CT CHEST ABDOMEN PELVIS W CONTRAST  Result Date: 12/24/2021 CLINICAL DATA:  Non-small cell lung cancer.  * Tracking Code: BO * EXAM: CT CHEST, ABDOMEN, AND PELVIS WITH CONTRAST TECHNIQUE: Multidetector CT imaging of the chest, abdomen and pelvis was performed following the standard protocol during bolus administration of intravenous contrast. RADIATION DOSE REDUCTION: This exam was performed  according to the departmental dose-optimization program which includes automated exposure control, adjustment of the mA and/or kV according to patient size and/or use of iterative reconstruction technique. CONTRAST:  169mL OMNIPAQUE IOHEXOL 300 MG/ML  SOLN COMPARISON:  CT chest 10/01/2021, PET 04/16/2021. FINDINGS: CT CHEST FINDINGS Cardiovascular: Atherosclerotic calcification of the aorta and coronary arteries. Heart size normal. No pericardial effusion. Mediastinum/Nodes: Heterogeneous low-attenuation left thyroid nodule measures 1.9 cm, as before. No pathologically  enlarged mediastinal, hilar or axillary lymph nodes. Esophagus is grossly unremarkable. Lungs/Pleura: Centrilobular and paraseptal emphysema with extensive biapical pleuroparenchymal scarring. Moderate to large right pleural effusion, increased from 10/01/2021 with adjacent ground-glass and volume loss in the right lower lobe. 1.0 x 1.5 cm necrotic appearing nodule in the medial right lower lobe (6/102), stable. 6 mm anterior lingular nodule (6/65), also stable with slight increase in surrounding volume loss, related to radiation therapy. Additional mucoid impaction and scarring in the surrounding lingula. No left pleural fluid. Minimal debris in the airway. Musculoskeletal: Degenerative changes in the spine. No worrisome lytic or sclerotic lesions. CT ABDOMEN PELVIS FINDINGS Hepatobiliary: Liver and gallbladder are unremarkable. No biliary ductal dilatation. Pancreas: Negative. Spleen: Negative. Adrenals/Urinary Tract: Adrenal glands are unremarkable. Subcentimeter low-attenuation lesions in the kidneys are too small to characterize. No specific follow-up necessary. Bladder is grossly unremarkable. Stomach/Bowel: Stomach, small bowel and colon are unremarkable. Appendix is not confidently visualized. Vascular/Lymphatic: Degenerative changes in the spine. No pathologically enlarged lymph nodes. Reproductive: Hysterectomy.  No adnexal mass. Other: No free fluid.  Mesenteries and peritoneum are unremarkable. Musculoskeletal: Degenerative changes in the spine. No worrisome lytic or sclerotic lesions. IMPRESSION: 1. Primary right lower lobe and lingular nodules are stable from 10/01/2021. No evidence metastatic disease. 2. Moderate to large right pleural effusion, increased. 3. 1.9 cm left thyroid nodule. Recommend thyroid ultrasound. (Ref: J Am Coll Radiol. 2015 Feb;12(2): 143-50). 4. Aortic atherosclerosis (ICD10-I70.0). Coronary artery calcification. 5.  Emphysema (ICD10-J43.9). Electronically Signed   By: Lorin Picket M.D.   On: 12/24/2021 11:42    ASSESSMENT & PLAN Robin Bryant 71 y.o. female with medical history significant for adenocarcinoma of the lung stage Ia who presents for a follow up visit.  Regarding her prior history, per Dr. Worthy Flank Note: " CT scan of the chest on March 05, 2021 and it showed bilateral pulmonary nodules measuring up to 1.8 x 2.3 cm in the medial right lower lobe.  There was also subpleural scarring in the peripheral left lower lobe.  A PET scan was performed on April 16, 2021 and that showed no thoracic nodal hypermetabolism.  A left upper lobe pulmonary nodule measured 0.8 cm with SUV of 2.6.  More anterior left upper lobe pulmonary nodule measured 1.0 cm with SUV of 4.6.  A lingular nodule measuring 0.6 cm with SUV of 1.6.  There was also a pleural-based right lower lobe dominant irregular pulmonary nodule measuring 1.8 x 1.6 with SUV max of 9.0.  There was no evidence for extrathoracic hypermetabolic metastasis.  The patient was referred to Dr. Valeta Harms and CT super D of the chest was performed on May 11, 2021 for preparation of the navigational bronchoscopy.  On May 15, 2021 the patient underwent video bronchoscopy with robotic assisted bronchoscopic navigation under the care of Dr. Valeta Harms.  The final cytology ([id number]) of the right lower lobe brushing and fine-needle aspiration showed malignant cells consistent with adenocarcinoma.  The fine-needle aspiration as well as the brushing of the left upper lobe lung nodules showed atypical cells present. The  patient was referred to Dr. Sondra Come for consideration of SBRT.  "  # Adenocarcinoma of the Lung, Stage Ia (T1b, N0, M0) -- At this time it appears as though the patient has had a complete response to radiation therapy -- Recommend repeat CT scan of the chest in 3 months time with a return to clinic --Encouraged continued weight gain -- Return to clinic in 3 months time  #Thyroid Lesion -- Currently under  evaluation by a physician in [place].  The patient reports that she would like her follow-up regarding this lesion with them.  Orders Placed This Encounter  Procedures   US Thoracentesis Asp Pleural space w/IMG guide    Standing Status:   Future    Standing Expiration Date:   12/24/2022    Order Specific Question:   Are labs required for specimen collection?    Answer:   Yes    Order Specific Question:   Lab orders requested (DO NOT place separate lab orders, these will be automatically ordered during procedure specimen collection):    Answer:   Cytology - Non Pap    Order Specific Question:   Lab orders requested (DO NOT place separate lab orders, these will be automatically ordered during procedure specimen collection):    Answer:   Albumin, Body Fluid    Order Specific Question:   Lab orders requested (DO NOT place separate lab orders, these will be automatically ordered during procedure specimen collection):    Answer:   Lactate Dehydrogenase, Body Fluid    Order Specific Question:   Lab orders requested (DO NOT place separate lab orders, these will be automatically ordered during procedure specimen collection):    Answer:   Protein, Pleural Or Peritoneal Fluid    Order Specific Question:   Reason for Exam (SYMPTOM  OR DIAGNOSIS REQUIRED)    Answer:   large right sided pleural effusion, hx of lung cancer    Order Specific Question:   Preferred imaging location?    Answer:   [facility]   All questions were answered. The patient knows to call the clinic with any problems, questions or concerns.  A total of more than 30 minutes were spent on this encounter with face-to-face time and non-face-to-face time, including preparing to see the patient, ordering tests and/or medications, counseling the patient and coordination of care as outlined above.   Ledell Peoples, MD Department of Hematology/Oncology [facility] Phone:  [phone] Pager: [phone] Email: [email or web address]  12/24/2021 4:42 PM

## 2021-12-25 ENCOUNTER — Telehealth: Payer: Self-pay | Admitting: Hematology and Oncology

## 2021-12-25 NOTE — Telephone Encounter (Signed)
Per 8/7 los called and spoke to pt about appointment.  Pt confirmed appointment

## 2022-01-08 ENCOUNTER — Telehealth: Payer: Self-pay | Admitting: *Deleted

## 2022-01-08 NOTE — Telephone Encounter (Signed)
Patient's partner Mr. Robin Bryant called - LVM. Patient received message to schedule appt for draining fluid off her lungs from that department. He said she deleted the message and the phone number with it. They would like the number to call. Contacted Mr. Robin Bryant (on patient's Designated Party Release of Information). Inquired how Robin Bryant was doing. 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 He said she didn't have the procedure when it was ordered because she was scared and had to read on it. He said she has decided to have it now. Gave him number for Radiology Central Scheduling and encouraged them to contact office for questions or concerns. He verbalized understanding

## 2022-02-04 ENCOUNTER — Ambulatory Visit (HOSPITAL_COMMUNITY)
Admission: RE | Admit: 2022-02-04 | Discharge: 2022-02-04 | Disposition: A | Discharge status: Home / Self Care | Payer: Medicare Other | Source: Ambulatory Visit | Attending: Hematology and Oncology | Admitting: Hematology and Oncology

## 2022-02-04 ENCOUNTER — Ambulatory Visit (HOSPITAL_COMMUNITY)
Admission: RE | Admit: 2022-02-04 | Discharge: 2022-02-04 | Disposition: A | Discharge status: Home / Self Care | Payer: Medicare Other | Source: Ambulatory Visit | Attending: Radiology | Admitting: Radiology

## 2022-02-04 VITALS — BP 172/130

## 2022-02-04 DIAGNOSIS — J90 Pleural effusion, not elsewhere classified: Secondary | ICD-10-CM | POA: Insufficient documentation

## 2022-02-04 DIAGNOSIS — C3490 Malignant neoplasm of unspecified part of unspecified bronchus or lung: Secondary | ICD-10-CM | POA: Diagnosis not present

## 2022-02-04 DIAGNOSIS — C3431 Malignant neoplasm of lower lobe, right bronchus or lung: Secondary | ICD-10-CM

## 2022-02-04 LAB — LACTATE DEHYDROGENASE, PLEURAL OR PERITONEAL FLUID: LD, Fluid: 129 U/L — ABNORMAL HIGH (ref 3–23)

## 2022-02-04 LAB — ALBUMIN, PLEURAL OR PERITONEAL FLUID: Albumin, Fluid: 2.8 g/dL

## 2022-02-04 LAB — PROTEIN, PLEURAL OR PERITONEAL FLUID: Total protein, fluid: 4.2 g/dL

## 2022-02-04 MED ORDER — LIDOCAINE HCL 1 % IJ SOLN
INTRAMUSCULAR | Status: AC
  Administered 2022-02-04: 10 mL
  Filled 2022-02-04: qty 20

## 2022-02-04 NOTE — Procedures (Signed)
Ultrasound-guided diagnostic and therapeutic right thoracentesis performed yielding 1.7 liters of yellow fluid. No immediate complications. Follow-up chest x-ray pending. A portion of the fluid was sent to the lab for preordered studies. EBL < 2 cc. Due to this being pt's initial thoracentesis and pt coughing only the above amount of fluid was removed today.

## 2022-02-06 ENCOUNTER — Telehealth: Payer: Self-pay | Admitting: *Deleted

## 2022-02-06 LAB — CYTOLOGY - NON PAP

## 2022-02-06 NOTE — Telephone Encounter (Signed)
Received call from pt's friend, [name]. He states pt had thoracentesis on 02/04/22. He is asking about the results of the samples they sent off for testing.  Please advise.

## 2022-02-11 ENCOUNTER — Telehealth: Payer: Self-pay | Admitting: *Deleted

## 2022-02-11 NOTE — Telephone Encounter (Signed)
Received call from pt's friend [name]. He is asking about the results of Robin Bryant's cytology s/p thoracentesis. Please advise.  I will send a scheduling request for an appt sooner than November.  Dr. Lorenso Courier is aware. Pt is to be scheduled on 02/18/22

## 2022-02-18 ENCOUNTER — Other Ambulatory Visit: Payer: Medicare Other

## 2022-02-18 ENCOUNTER — Ambulatory Visit: Payer: Medicare Other | Admitting: Hematology and Oncology

## 2022-02-19 ENCOUNTER — Ambulatory Visit: Payer: Medicare Other | Admitting: Hematology and Oncology

## 2022-02-19 ENCOUNTER — Other Ambulatory Visit: Payer: Medicare Other

## 2022-02-22 ENCOUNTER — Inpatient Hospital Stay (HOSPITAL_BASED_OUTPATIENT_CLINIC_OR_DEPARTMENT_OTHER): Payer: Medicare Other | Admitting: Hematology and Oncology

## 2022-02-22 ENCOUNTER — Inpatient Hospital Stay: Payer: Medicare Other | Attending: Hematology and Oncology

## 2022-02-22 ENCOUNTER — Other Ambulatory Visit: Payer: Self-pay | Admitting: Hematology and Oncology

## 2022-02-22 ENCOUNTER — Other Ambulatory Visit: Payer: Self-pay

## 2022-02-22 VITALS — BP 218/103 | HR 72 | Temp 97.3°F | Resp 15 | Wt 98.2 lb

## 2022-02-22 DIAGNOSIS — Z801 Family history of malignant neoplasm of trachea, bronchus and lung: Secondary | ICD-10-CM | POA: Diagnosis not present

## 2022-02-22 DIAGNOSIS — Z87891 Personal history of nicotine dependence: Secondary | ICD-10-CM | POA: Diagnosis not present

## 2022-02-22 DIAGNOSIS — C3490 Malignant neoplasm of unspecified part of unspecified bronchus or lung: Secondary | ICD-10-CM

## 2022-02-22 DIAGNOSIS — Z809 Family history of malignant neoplasm, unspecified: Secondary | ICD-10-CM | POA: Insufficient documentation

## 2022-02-22 DIAGNOSIS — J90 Pleural effusion, not elsewhere classified: Secondary | ICD-10-CM | POA: Diagnosis not present

## 2022-02-22 DIAGNOSIS — C3431 Malignant neoplasm of lower lobe, right bronchus or lung: Secondary | ICD-10-CM | POA: Diagnosis present

## 2022-02-22 DIAGNOSIS — E079 Disorder of thyroid, unspecified: Secondary | ICD-10-CM | POA: Insufficient documentation

## 2022-02-22 LAB — CBC WITH DIFFERENTIAL (CANCER CENTER ONLY)
Abs Immature Granulocytes: 0.06 10*3/uL (ref 0.00–0.07)
Basophils Absolute: 0 10*3/uL (ref 0.0–0.1)
Basophils Relative: 0 %
Eosinophils Absolute: 0.1 10*3/uL (ref 0.0–0.5)
Eosinophils Relative: 0 %
HCT: 42.1 % (ref 36.0–46.0)
Hemoglobin: 14 g/dL (ref 12.0–15.0)
Immature Granulocytes: 1 %
Lymphocytes Relative: 7 %
Lymphs Abs: 0.8 10*3/uL (ref 0.7–4.0)
MCH: 32.1 pg (ref 26.0–34.0)
MCHC: 33.3 g/dL (ref 30.0–36.0)
MCV: 96.6 fL (ref 80.0–100.0)
Monocytes Absolute: 1 10*3/uL (ref 0.1–1.0)
Monocytes Relative: 8 %
Neutro Abs: 10.2 10*3/uL — ABNORMAL HIGH (ref 1.7–7.7)
Neutrophils Relative %: 84 %
Platelet Count: 346 10*3/uL (ref 150–400)
RBC: 4.36 MIL/uL (ref 3.87–5.11)
RDW: 14.1 % (ref 11.5–15.5)
WBC Count: 12.2 10*3/uL — ABNORMAL HIGH (ref 4.0–10.5)
nRBC: 0 % (ref 0.0–0.2)

## 2022-02-22 LAB — CMP (CANCER CENTER ONLY)
ALT: 15 U/L (ref 0–44)
AST: 24 U/L (ref 15–41)
Albumin: 3.6 g/dL (ref 3.5–5.0)
Alkaline Phosphatase: 70 U/L (ref 38–126)
Anion gap: 3 — ABNORMAL LOW (ref 5–15)
BUN: 25 mg/dL — ABNORMAL HIGH (ref 8–23)
CO2: 33 mmol/L — ABNORMAL HIGH (ref 22–32)
Calcium: 8.9 mg/dL (ref 8.9–10.3)
Chloride: 106 mmol/L (ref 98–111)
Creatinine: 0.89 mg/dL (ref 0.44–1.00)
GFR, Estimated: >60 mL/min (ref >60)
Glucose, Bld: 115 mg/dL — ABNORMAL HIGH (ref 70–99)
Potassium: 4.9 mmol/L (ref 3.5–5.1)
Sodium: 142 mmol/L (ref 135–145)
Total Bilirubin: 0.2 mg/dL — ABNORMAL LOW (ref 0.3–1.2)
Total Protein: 6.7 g/dL (ref 6.5–8.1)

## 2022-02-22 NOTE — Progress Notes (Signed)
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  06/18/2021: last visit with Dr. Julien Nordmann.  2/8-2/21/2023: SBRT with Dr. Sondra Come.  10/01/2021: CT Chest w contrast shows response to therapy of right lower and left upper lobe pulmonary nodules. 10/08/2021: establish care with Dr. Lorenso Courier  12/24/2021: CT C/A/P performed. Preliminary read shows increased pleural effusion but no overt evidence of recurrence.  02/04/2022: Thoracentesis performed, fluid consistent with adenocarcinoma of the lung.   Interval History:  Robin Bryant 71 y.o. female with medical history significant for adenocarcinoma of the lung stage Ia who presents for a follow up visit. The patient's last visit was on 12/24/2021. In the interim since the last visit she had a thoracentesis performed on 02/04/2022 and cytology review showed the fluid to be consistent with containing adenocarcinoma of the lung.  On exam today Mrs. Nolton reports she has been "up-and-down" in the interim since her last visit.  She continues to have severe back pain from her scoliosis.  She notes that she was evaluated by neurosurgery who recommended that she gained some weight prior to consideration of surgery.  She reports that her breathing has been okay.  She notes that she did feel a lot better when the fluid was removed.  Approximately 1.7 L of fluid was removed at that time.  She is otherwise been at her baseline level of health.  She otherwise denies any fevers, chills, sweats, nausea, vomiting or diarrhea.  Full 10 point ROS was otherwise negative.  The bulk of our discussion focused on the results of the pleural  fluid and the steps moving forward.  Unfortunately given these findings she is not considered a stage IV cancer.  She will require treatment.  We discussed the additional testing required moving forward and the next steps.  MEDICAL HISTORY:  Past Medical History:  Diagnosis Date   Back pain    History of radiation therapy    Left Lung, Right Lung 06/27/21-07/10/21- Dr. Gery Pray   HTN (hypertension)    Scoliosis    Sinus congestion     SURGICAL HISTORY: Past Surgical History:  Procedure Laterality Date   ABDOMINAL HYSTERECTOMY     BACK SURGERY     BRONCHIAL BIOPSY  05/15/2021   Procedure: BRONCHIAL BIOPSIES;  Surgeon: Garner Nash, DO;  Location: [facility];  Service: Pulmonary;;   BRONCHIAL BRUSHINGS  05/15/2021   Procedure: BRONCHIAL BRUSHINGS;  Surgeon: Garner Nash, DO;  Location: [facility];  Service: Pulmonary;;   BRONCHIAL NEEDLE ASPIRATION BIOPSY  05/15/2021   Procedure: BRONCHIAL NEEDLE ASPIRATION BIOPSIES;  Surgeon: Garner Nash, DO;  Location: [facility];  Service: Pulmonary;;   BRONCHIAL WASHINGS  05/15/2021   Procedure: BRONCHIAL WASHINGS;  Surgeon: Garner Nash, DO;  Location: [facility];  Service: Pulmonary;;   VIDEO BRONCHOSCOPY WITH RADIAL ENDOBRONCHIAL ULTRASOUND  05/15/2021   Procedure: RADIAL ENDOBRONCHIAL ULTRASOUND;  Surgeon: Garner Nash, DO;  Location: [facility];  Service: Pulmonary;;    SOCIAL HISTORY: Social History   Socioeconomic History   Marital status: Divorced    Spouse name: Not on file   Number of children: Not on file   Years of education: Not on file   Highest education level: Not  on file  Occupational History   Not on file  Tobacco Use   Smoking status: Former    Packs/day: 0.50    Years: 50.00    Total pack years: 25.00    Types: Cigarettes    Quit date: 04/04/2021    Years since quitting: 0.8   Smokeless tobacco: Never  Vaping Use   Vaping Use: Never used  Substance and Sexual Activity    Alcohol use: Never   Drug use: Never   Sexual activity: Not on file  Other Topics Concern   Not on file  Social History Narrative   Not on file   Social Determinants of Health   Financial Resource Strain: Not on file  Food Insecurity: Not on file  Transportation Needs: Not on file  Physical Activity: Not on file  Stress: Not on file  Social Connections: Not on file  Intimate Partner Violence: Not on file    FAMILY HISTORY: Family History  Problem Relation Age of Onset   Alzheimer's disease Mother    Cancer Father    Lung cancer Father     ALLERGIES:  is allergic to influenza vaccines and other.  MEDICATIONS:  Current Outpatient Medications  Medication Sig Dispense Refill   amLODipine (NORVASC) 5 MG tablet Take 2 tablets (10 mg total) by mouth daily. 60 tablet 1   aspirin EC 81 MG tablet Take 81 mg by mouth every other day. Swallow whole.     Biotin 5000 MCG TABS Take 5,000 mcg by mouth daily.     Calcium Carb-Cholecalciferol (OYSTER SHELL CALCIUM W/D) 500-5 MG-MCG TABS Take 1 tablet by mouth 2 (two) times daily.     Cholecalciferol (VITAMIN D-3) 125 MCG (5000 UT) TABS Take 5,000 Units by mouth daily.     EUTHYROX 50 MCG tablet Take 50 mcg by mouth every morning.     fluticasone (FLONASE) 50 MCG/ACT nasal spray Place 1 spray into both nostrils daily.     gabapentin (NEURONTIN) 800 MG tablet Take 800 mg by mouth 4 (four) times daily.     HYDROcodone-acetaminophen (NORCO) 10-325 MG tablet Take 1 tablet by mouth 4 (four) times daily as needed for moderate pain or severe pain.     metoprolol succinate (TOPROL-XL) 25 MG 24 hr tablet Take 25 mg by mouth daily.     morphine (MS CONTIN) 15 MG 12 hr tablet Take 1 tablet (15 mg total) by mouth every 12 (twelve) hours. (Patient not taking: Reported on 08/13/2021) 60 tablet 0   Polyethylene Glycol 400 (BLINK TEARS) 0.25 % SOLN Place 1 drop into both eyes daily.     tretinoin (RETIN-A) 0.05 % cream Apply topically daily.     Vitamin A  2400 MCG (8000 UT) CAPS Take 2,400 mg by mouth daily.     vitamin B-12 (CYANOCOBALAMIN) 1000 MCG tablet Take 1,000 mcg by mouth daily.     vitamin C (ASCORBIC ACID) 500 MG tablet Take 500 mg by mouth daily.     vitamin E 1000 UNIT capsule Take 1,000 Units by mouth daily.     zinc gluconate 50 MG tablet Take 50 mg by mouth daily.     No current facility-administered medications for this visit.    REVIEW OF SYSTEMS:   Constitutional: ( - ) fevers, ( - )  chills , ( - ) night sweats Eyes: ( - ) blurriness of vision, ( - ) double vision, ( - ) watery eyes Ears, nose, mouth, throat, and face: ( - ) mucositis, ( - )  sore throat Respiratory: ( - ) cough, ( - ) dyspnea, ( - ) wheezes Cardiovascular: ( - ) palpitation, ( - ) chest discomfort, ( - ) lower extremity swelling Gastrointestinal:  ( - ) nausea, ( - ) heartburn, ( - ) change in bowel habits Skin: ( - ) abnormal skin rashes Lymphatics: ( - ) new lymphadenopathy, ( - ) easy bruising Neurological: ( - ) numbness, ( - ) tingling, ( - ) new weaknesses Behavioral/Psych: ( - ) mood change, ( - ) new changes  All other systems were reviewed with the patient and are negative.  PHYSICAL EXAMINATION: ECOG PERFORMANCE STATUS: 1 - Symptomatic but completely ambulatory  There were no vitals filed for this visit.  There were no vitals filed for this visit.   GENERAL: Well-appearing middle-age Caucasian female.  Quite thin.  Alert, no distress and comfortable SKIN: skin color, texture, turgor are normal, no rashes or significant lesions EYES: conjunctiva are pink and non-injected, sclera clear LUNGS: clear to auscultation and percussion with normal breathing effort HEART: regular rate & rhythm and no murmurs and no lower extremity edema Musculoskeletal: no cyanosis of digits and no clubbing  PSYCH: alert & oriented x 3, fluent speech NEURO: no focal motor/sensory deficits  LABORATORY DATA:  I have reviewed the data as listed    Latest Ref  Rng & Units 12/24/2021    7:43 AM 10/01/2021   10:04 AM 05/15/2021    7:29 AM  CBC  WBC 4.0 - 10.5 K/uL 8.7  7.0  8.1   Hemoglobin 12.0 - 15.0 g/dL 15.0  13.3  13.9   Hematocrit 36.0 - 46.0 % 45.0  40.3  42.4   Platelets 150 - 400 K/uL 213  181  215        Latest Ref Rng & Units 12/24/2021    7:43 AM 10/01/2021   10:04 AM 05/15/2021    7:29 AM  CMP  Glucose 70 - 99 mg/dL 88  93  84   BUN 8 - 23 mg/dL '17  19  15   ' Creatinine 0.44 - 1.00 mg/dL 0.93  0.77  0.69   Sodium 135 - 145 mmol/L 143  142  143   Potassium 3.5 - 5.1 mmol/L 3.8  4.3  3.5   Chloride 98 - 111 mmol/L 102  107  107   CO2 22 - 32 mmol/L 34  32  29   Calcium 8.9 - 10.3 mg/dL 9.7  9.0  8.8   Total Protein 6.5 - 8.1 g/dL 7.2  6.5    Total Bilirubin 0.3 - 1.2 mg/dL 0.4  0.4    Alkaline Phos 38 - 126 U/L 68  56    AST 15 - 41 U/L 21  16    ALT 0 - 44 U/L 13  10      RADIOGRAPHIC STUDIES: I have personally reviewed the radiological images as listed and agreed with the findings in the report: response to radiation therapy with improved nodules.   US Thoracentesis Asp Pleural space w/IMG guide  Result Date: 02/04/2022 INDICATION: Patient with history of lung adenocarcinoma, dyspnea, right pleural effusion. Request received for diagnostic and therapeutic right thoracentesis. EXAM: ULTRASOUND GUIDED DIAGNOSTIC AND THERAPEUTIC RIGHT THORACENTESIS MEDICATIONS: 8 mL 1% lidocaine COMPLICATIONS: None immediate. PROCEDURE: An ultrasound guided thoracentesis was thoroughly discussed with the patient and questions answered. The benefits, risks, alternatives and complications were also discussed. The patient understands and wishes to proceed with the procedure. Written consent was obtained. Ultrasound  was performed to localize and mark an adequate pocket of fluid in the right chest. The area was then prepped and draped in the normal sterile fashion. 1% Lidocaine was used for local anesthesia. Under ultrasound guidance a 6 Fr Safe-T-Centesis  catheter was introduced. Thoracentesis was performed. The catheter was removed and a dressing applied. FINDINGS: A total of approximately 1.7 liters of yellow fluid was removed. Samples were sent to the laboratory as requested by the clinical team. Due to this being patient's initial thoracentesis and patient coughing only the above amount of fluid was removed today. IMPRESSION: Successful ultrasound guided diagnostic and therapeutic right thoracentesis yielding 1.7 liters of pleural fluid. Read by: Rowe Robert, PA-C Electronically Signed   By: Jacqulynn Cadet M.D.   On: 02/04/2022 12:03   DG Chest 1 View  Result Date: 02/04/2022 CLINICAL DATA:  Post right thoracentesis EXAM: CHEST  1 VIEW COMPARISON:  Previous CT chest done on 12/24/2021 and chest radiographs done on 05/15/2021 FINDINGS: Cardiac size is within normal limits. There are no signs of alveolar pulmonary edema. There is moderate right pleural effusion. There is no pneumothorax. Pleural calcifications are seen in the right apex. Pleural thickening is seen in both apices with no significant interval change. Low position of diaphragms suggests COPD. Increased interstitial markings are seen in left upper lung field, left parahilar region and both lower lung fields. IMPRESSION: Moderate right pleural effusion. There is no pneumothorax. COPD. Increased interstitial markings are seen in both lungs, more so in the left parahilar region. Findings suggest interstitial pneumonia. Part of this finding may suggest underlying scarring. Electronically Signed   By: Elmer Picker M.D.   On: 02/04/2022 11:58    ASSESSMENT & PLAN Edwinna Rochette 71 y.o. female with medical history significant for adenocarcinoma of the lung stage Ia who presents for a follow up visit.  Regarding her prior history, per Dr. Worthy Flank Note: " CT scan of the chest on March 05, 2021 and it showed bilateral pulmonary nodules measuring up to 1.8 x 2.3 cm in the medial right  lower lobe.  There was also subpleural scarring in the peripheral left lower lobe.  A PET scan was performed on April 16, 2021 and that showed no thoracic nodal hypermetabolism.  A left upper lobe pulmonary nodule measured 0.8 cm with SUV of 2.6.  More anterior left upper lobe pulmonary nodule measured 1.0 cm with SUV of 4.6.  A lingular nodule measuring 0.6 cm with SUV of 1.6.  There was also a pleural-based right lower lobe dominant irregular pulmonary nodule measuring 1.8 x 1.6 with SUV max of 9.0.  There was no evidence for extrathoracic hypermetabolic metastasis.  The patient was referred to Dr. Valeta Harms and CT super D of the chest was performed on May 11, 2021 for preparation of the navigational bronchoscopy.  On May 15, 2021 the patient underwent video bronchoscopy with robotic assisted bronchoscopic navigation under the care of Dr. Valeta Harms.  The final cytology ([id number]) of the right lower lobe brushing and fine-needle aspiration showed malignant cells consistent with adenocarcinoma.  The fine-needle aspiration as well as the brushing of the left upper lobe lung nodules showed atypical cells present. The patient was referred to Dr. Sondra Come for consideration of SBRT. "   # Adenocarcinoma of the Lung, Stage Ia (T1b, N0, M0) # Recurrent in Pleural Effusion, Adenocarcinoma of the Lung Stage IV --Unfortunately cytology on her pleural effusion was consistent with recurrence of her lung cancer with the presence of adenocarcinoma, consistent with  lung cancer. --Patient will require molecular testing of her tumor.  We will request NGS studies and PD-L1 testing from her original tumor.  Additionally ordered a Guardant360 today --Pending the results of the studies we will have the patient back to clinic to discuss the treatment options moving forward.  #Thyroid Lesion -- Currently under evaluation by a physician in [place].  The patient reports that she would like her follow-up  regarding this lesion with them.  No orders of the defined types were placed in this encounter.  All questions were answered. The patient knows to call the clinic with any problems, questions or concerns.  A total of more than 30 minutes were spent on this encounter with face-to-face time and non-face-to-face time, including preparing to see the patient, ordering tests and/or medications, counseling the patient and coordination of care as outlined above.   Ledell Peoples, MD Department of Hematology/Oncology [place] at [facility] Phone: [phone] Pager: [phone] Email: [email or web address]  02/22/2022 1:50 PM

## 2022-02-25 ENCOUNTER — Telehealth: Payer: Self-pay | Admitting: Hematology and Oncology

## 2022-02-25 NOTE — Telephone Encounter (Signed)
Per 10/6 los called and left message for pt about appointment

## 2022-02-28 ENCOUNTER — Encounter (HOSPITAL_COMMUNITY): Payer: Self-pay | Admitting: Hematology and Oncology

## 2022-03-01 ENCOUNTER — Telehealth: Payer: Self-pay | Admitting: Hematology and Oncology

## 2022-03-01 ENCOUNTER — Telehealth: Payer: Self-pay | Admitting: *Deleted

## 2022-03-01 NOTE — Telephone Encounter (Signed)
Left message with rescheduled upcoming appointment per 10/13 schedule message.

## 2022-03-01 NOTE — Telephone Encounter (Signed)
Received call from pt's friend, [name]. He is asking if the genetic testing has come back yet on Robin Bryant (Guardant 360). Advised that it is not back yet. Advised that it can take more than 10 days-3 weeks to be completed. He voiced understanding. 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 Scheduling message sent to change appts to Mondays.

## 2022-03-04 ENCOUNTER — Ambulatory Visit: Payer: Medicare Other | Admitting: Hematology and Oncology

## 2022-03-04 ENCOUNTER — Other Ambulatory Visit: Payer: Medicare Other

## 2022-03-07 ENCOUNTER — Encounter: Payer: Self-pay | Admitting: Hematology and Oncology

## 2022-03-11 ENCOUNTER — Ambulatory Visit: Payer: Medicare Other | Admitting: Pulmonary Disease

## 2022-03-15 ENCOUNTER — Ambulatory Visit: Payer: Medicare Other | Admitting: Hematology and Oncology

## 2022-03-15 ENCOUNTER — Other Ambulatory Visit: Payer: Medicare Other

## 2022-03-18 ENCOUNTER — Other Ambulatory Visit: Payer: Self-pay | Admitting: Hematology and Oncology

## 2022-03-18 ENCOUNTER — Inpatient Hospital Stay (HOSPITAL_BASED_OUTPATIENT_CLINIC_OR_DEPARTMENT_OTHER): Payer: Medicare Other | Admitting: Hematology and Oncology

## 2022-03-18 ENCOUNTER — Other Ambulatory Visit (HOSPITAL_COMMUNITY): Payer: Self-pay

## 2022-03-18 ENCOUNTER — Inpatient Hospital Stay: Payer: Medicare Other

## 2022-03-18 ENCOUNTER — Encounter (HOSPITAL_COMMUNITY): Payer: Self-pay | Admitting: Hematology and Oncology

## 2022-03-18 ENCOUNTER — Telehealth: Payer: Self-pay | Admitting: Pharmacy Technician

## 2022-03-18 ENCOUNTER — Other Ambulatory Visit: Payer: Self-pay

## 2022-03-18 ENCOUNTER — Telehealth: Payer: Self-pay | Admitting: Pharmacist

## 2022-03-18 VITALS — BP 190/98 | HR 63 | Temp 98.3°F | Resp 14 | Wt 91.3 lb

## 2022-03-18 DIAGNOSIS — C3490 Malignant neoplasm of unspecified part of unspecified bronchus or lung: Secondary | ICD-10-CM

## 2022-03-18 DIAGNOSIS — C3431 Malignant neoplasm of lower lobe, right bronchus or lung: Secondary | ICD-10-CM | POA: Diagnosis not present

## 2022-03-18 LAB — CMP (CANCER CENTER ONLY)
ALT: 12 U/L (ref 0–44)
AST: 20 U/L (ref 15–41)
Albumin: 3.8 g/dL (ref 3.5–5.0)
Alkaline Phosphatase: 61 U/L (ref 38–126)
Anion gap: 4 — ABNORMAL LOW (ref 5–15)
BUN: 23 mg/dL (ref 8–23)
CO2: 34 mmol/L — ABNORMAL HIGH (ref 22–32)
Calcium: 9.5 mg/dL (ref 8.9–10.3)
Chloride: 104 mmol/L (ref 98–111)
Creatinine: 0.92 mg/dL (ref 0.44–1.00)
GFR, Estimated: >60 mL/min (ref >60)
Glucose, Bld: 96 mg/dL (ref 70–99)
Potassium: 4.4 mmol/L (ref 3.5–5.1)
Sodium: 142 mmol/L (ref 135–145)
Total Bilirubin: 0.3 mg/dL (ref 0.3–1.2)
Total Protein: 6.8 g/dL (ref 6.5–8.1)

## 2022-03-18 LAB — CBC WITH DIFFERENTIAL (CANCER CENTER ONLY)
Abs Immature Granulocytes: 0.04 10*3/uL (ref 0.00–0.07)
Basophils Absolute: 0.1 10*3/uL (ref 0.0–0.1)
Basophils Relative: 1 %
Eosinophils Absolute: 0.3 10*3/uL (ref 0.0–0.5)
Eosinophils Relative: 3 %
HCT: 42.4 % (ref 36.0–46.0)
Hemoglobin: 14.1 g/dL (ref 12.0–15.0)
Immature Granulocytes: 0 %
Lymphocytes Relative: 7 %
Lymphs Abs: 0.7 10*3/uL (ref 0.7–4.0)
MCH: 32.6 pg (ref 26.0–34.0)
MCHC: 33.3 g/dL (ref 30.0–36.0)
MCV: 97.9 fL (ref 80.0–100.0)
Monocytes Absolute: 0.9 10*3/uL (ref 0.1–1.0)
Monocytes Relative: 9 %
Neutro Abs: 8 10*3/uL — ABNORMAL HIGH (ref 1.7–7.7)
Neutrophils Relative %: 80 %
Platelet Count: 243 10*3/uL (ref 150–400)
RBC: 4.33 MIL/uL (ref 3.87–5.11)
RDW: 14.1 % (ref 11.5–15.5)
WBC Count: 10 10*3/uL (ref 4.0–10.5)
nRBC: 0 % (ref 0.0–0.2)

## 2022-03-18 MED ORDER — OSIMERTINIB MESYLATE 80 MG PO TABS
80.0000 mg | ORAL_TABLET | Freq: Every day | ORAL | 3 refills | Status: DC
  Filled 2022-03-18: qty 30, 30d supply, fill #0

## 2022-03-18 MED ORDER — OSIMERTINIB MESYLATE 80 MG PO TABS: 80.0000 mg | ORAL_TABLET | Freq: Every day | ORAL | 3 refills | Status: DC

## 2022-03-18 MED ORDER — MORPHINE SULFATE ER 15 MG PO TBCR: 15.0000 mg | EXTENDED_RELEASE_TABLET | Freq: Two times a day (BID) | ORAL | 0 refills | Status: DC

## 2022-03-18 NOTE — Telephone Encounter (Signed)
Oral Oncology Patient Advocate Encounter  Prior Authorization for [facility] has been approved.    PA# [id number] Effective dates: 02/16/2022 through 03/18/2023  Patient must fill at [facility].    Lady Deutscher, CPhT-Adv Oncology Pharmacy Patient [facility] Direct Number: [phone]  Fax: [phone]

## 2022-03-18 NOTE — Telephone Encounter (Addendum)
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  CBC w/ Diff and CMP from 03/18/22 assessed, no relevant lab abnormalities requiring baseline dose adjustment required at this time. Baseline EKG to be obtained by patient's PCP prior to starting [place]. Prescription dose and frequency assessed for appropriateness. Appropriate for therapy initiation.   Current medication list in Epic reviewed, DDIs with Tagrisso identified: Category C drug-drug interaction between [place] and Morphine - Tagrisso, a PgP/ABCB1 inhibitor may increase serum concentrations through this inhibition of morphine, as morphine is a PgP substrate. Recommend monitoring patient for increased signs of somnolence/side effects from morphine. No changes in therapy warranted at this time.  Evaluated chart and no patient barriers to medication adherence noted.   Patient agreement for treatment documented in MD note on 03/18/22.  Patient's insurance requires Tagrisso to be filled through [facility]. Prescription redirected for dispensing.   Oral Oncology Clinic will continue to follow for insurance authorization, copayment issues, initial counseling and start date.  Leron Croak, PharmD, BCPS, BCOP Hematology/Oncology Clinical Pharmacist [facility] [phone] 03/18/2022 4:14 PM

## 2022-03-18 NOTE — Progress Notes (Signed)
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 PDL1 0% 05/15/2021: bronchoscopy performed, biopsy consistent with adenocarcinoma of the lung.  06/18/2021: last visit with Dr. Julien Nordmann.  2/8-2/21/2023: SBRT with Dr. Sondra Come.  10/01/2021: CT Chest w contrast shows response to therapy of right lower and left upper lobe pulmonary nodules. 10/08/2021: establish care with Dr. Lorenso Courier  12/24/2021: CT C/A/P performed. Preliminary read shows increased pleural effusion but no overt evidence of recurrence.  02/04/2022: Thoracentesis performed, fluid consistent with adenocarcinoma of the lung.   Interval History:  Robin Bryant 71 y.o. female with medical history significant for adenocarcinoma of the lung stage Ia who presents for a follow up visit. The patient's last visit was on 02/22/2022. In the interim since the last visit she underwent NGS testing of blood and tumor tissue which revealed a EGFR L858 R mutation and 0% PD-L1.  On exam today Robin Bryant reports she continues to have some pain and discomfort where she originally had a thoracentesis to remove the fluid.  She reports that there is some soreness and burning and does occasionally have difficulty with swallowing when trying to eat.  She notes that she is not taking any medicines for this pain or discomfort.  She notes that she does not eat full meals and her husband has to finish the meals for her.  She reports that she has not been having any issues with cough and is not having any shortness of breath.  Her bowels are moving regularly.  She otherwise denies any fevers, chills, sweats, nausea, vomiting or diarrhea.  Full 10 point ROS was otherwise negative.  The bulk  of our discussion focused on the results of the genetic testing and tumor testing.  She is not a candidate for monotherapy pembrolizumab, but does have a targetable mutation which should be responsive to [place].  She is agreeable to starting [place] therapy.  We discussed the side effects and possible benefits of this medication.  MEDICAL HISTORY:  Past Medical History:  Diagnosis Date   Back pain    History of radiation therapy    Left Lung, Right Lung 06/27/21-07/10/21- Dr. Gery Pray   HTN (hypertension)    Scoliosis    Sinus congestion     SURGICAL HISTORY: Past Surgical History:  Procedure Laterality Date   ABDOMINAL HYSTERECTOMY     BACK SURGERY     BRONCHIAL BIOPSY  05/15/2021   Procedure: BRONCHIAL BIOPSIES;  Surgeon: Garner Nash, DO;  Location: [facility];  Service: Pulmonary;;   BRONCHIAL BRUSHINGS  05/15/2021   Procedure: BRONCHIAL BRUSHINGS;  Surgeon: Garner Nash, DO;  Location: [facility];  Service: Pulmonary;;   BRONCHIAL NEEDLE ASPIRATION BIOPSY  05/15/2021   Procedure: BRONCHIAL NEEDLE ASPIRATION BIOPSIES;  Surgeon: Garner Nash, DO;  Location: [facility];  Service: Pulmonary;;   BRONCHIAL WASHINGS  05/15/2021   Procedure: BRONCHIAL WASHINGS;  Surgeon: Garner Nash, DO;  Location: [facility];  Service: Pulmonary;;   VIDEO BRONCHOSCOPY WITH RADIAL ENDOBRONCHIAL ULTRASOUND  05/15/2021   Procedure: RADIAL ENDOBRONCHIAL ULTRASOUND;  Surgeon: Garner Nash, DO;  Location: [facility];  Service: Pulmonary;;    SOCIAL HISTORY: Social History   Socioeconomic History   Marital status: Divorced    Spouse name: Not on file  Number of children: Not on file   Years of education: Not on file   Highest education level: Not on file  Occupational History   Not on file  Tobacco Use   Smoking status: Former    Packs/day: 0.50    Years: 50.00    Total pack years: 25.00    Types: Cigarettes    Quit date: 04/04/2021    Years since  quitting: 0.9   Smokeless tobacco: Never  Vaping Use   Vaping Use: Never used  Substance and Sexual Activity   Alcohol use: Never   Drug use: Never   Sexual activity: Not on file  Other Topics Concern   Not on file  Social History Narrative   Not on file   Social Determinants of Health   Financial Resource Strain: Not on file  Food Insecurity: Not on file  Transportation Needs: Not on file  Physical Activity: Not on file  Stress: Not on file  Social Connections: Not on file  Intimate Partner Violence: Not on file    FAMILY HISTORY: Family History  Problem Relation Age of Onset   Alzheimer's disease Mother    Cancer Father    Lung cancer Father     ALLERGIES:  is allergic to influenza vaccines and other.  MEDICATIONS:  Current Outpatient Medications  Medication Sig Dispense Refill   amLODipine (NORVASC) 5 MG tablet Take 2 tablets (10 mg total) by mouth daily. 60 tablet 1   aspirin EC 81 MG tablet Take 81 mg by mouth every other day. Swallow whole.     Biotin 5000 MCG TABS Take 5,000 mcg by mouth daily.     Calcium Carb-Cholecalciferol (OYSTER SHELL CALCIUM W/D) 500-5 MG-MCG TABS Take 1 tablet by mouth 2 (two) times daily.     Cholecalciferol (VITAMIN D-3) 125 MCG (5000 UT) TABS Take 5,000 Units by mouth daily.     EUTHYROX 50 MCG tablet Take 50 mcg by mouth every morning.     fluticasone (FLONASE) 50 MCG/ACT nasal spray Place 1 spray into both nostrils daily.     gabapentin (NEURONTIN) 800 MG tablet Take 800 mg by mouth 4 (four) times daily.     HYDROcodone-acetaminophen (NORCO) 10-325 MG tablet Take 1 tablet by mouth 4 (four) times daily as needed for moderate pain or severe pain.     metoprolol succinate (TOPROL-XL) 25 MG 24 hr tablet Take 25 mg by mouth daily.     morphine (MS CONTIN) 15 MG 12 hr tablet Take 1 tablet (15 mg total) by mouth every 12 (twelve) hours. 60 tablet 0   Polyethylene Glycol 400 (BLINK TEARS) 0.25 % SOLN Place 1 drop into both eyes daily.      tretinoin (RETIN-A) 0.05 % cream Apply topically daily.     vitamin B-12 (CYANOCOBALAMIN) 1000 MCG tablet Take 1,000 mcg by mouth daily.     No current facility-administered medications for this visit.    REVIEW OF SYSTEMS:   Constitutional: ( - ) fevers, ( - )  chills , ( - ) night sweats Eyes: ( - ) blurriness of vision, ( - ) double vision, ( - ) watery eyes Ears, nose, mouth, throat, and face: ( - ) mucositis, ( - ) sore throat Respiratory: ( - ) cough, ( - ) dyspnea, ( - ) wheezes Cardiovascular: ( - ) palpitation, ( - ) chest discomfort, ( - ) lower extremity swelling Gastrointestinal:  ( - ) nausea, ( - ) heartburn, ( - ) change in bowel   habits Skin: ( - ) abnormal skin rashes Lymphatics: ( - ) new lymphadenopathy, ( - ) easy bruising Neurological: ( - ) numbness, ( - ) tingling, ( - ) new weaknesses Behavioral/Psych: ( - ) mood change, ( - ) new changes  All other systems were reviewed with the patient and are negative.  PHYSICAL EXAMINATION: ECOG PERFORMANCE STATUS: 1 - Symptomatic but completely ambulatory  Vitals:   03/18/22 1053  BP: (!) 197/83  Pulse: 68  Resp: 14  Temp: 98.3 F (36.8 C)  SpO2: 94%    Filed Weights   03/18/22 1053  Weight: 91 lb 4.8 oz (41.4 kg)     GENERAL: Well-appearing middle-age Caucasian female.  Quite thin.  Alert, no distress and comfortable SKIN: skin color, texture, turgor are normal, no rashes or significant lesions EYES: conjunctiva are pink and non-injected, sclera clear LUNGS: clear to auscultation and percussion with normal breathing effort HEART: regular rate & rhythm and no murmurs and no lower extremity edema Musculoskeletal: no cyanosis of digits and no clubbing  PSYCH: alert & oriented x 3, fluent speech NEURO: no focal motor/sensory deficits  LABORATORY DATA:  I have reviewed the data as listed    Latest Ref Rng & Units 03/18/2022   10:31 AM 02/22/2022    1:46 PM 12/24/2021    7:43 AM  CBC  WBC 4.0 - 10.5 K/uL 10.0   12.2  8.7   Hemoglobin 12.0 - 15.0 g/dL 14.1  14.0  15.0   Hematocrit 36.0 - 46.0 % 42.4  42.1  45.0   Platelets 150 - 400 K/uL 243  346  213        Latest Ref Rng & Units 02/22/2022    1:46 PM 12/24/2021    7:43 AM 10/01/2021   10:04 AM  CMP  Glucose 70 - 99 mg/dL 115  88  93   BUN 8 - 23 mg/dL 25  17  19   Creatinine 0.44 - 1.00 mg/dL 0.89  0.93  0.77   Sodium 135 - 145 mmol/L 142  143  142   Potassium 3.5 - 5.1 mmol/L 4.9  3.8  4.3   Chloride 98 - 111 mmol/L 106  102  107   CO2 22 - 32 mmol/L 33  34  32   Calcium 8.9 - 10.3 mg/dL 8.9  9.7  9.0   Total Protein 6.5 - 8.1 g/dL 6.7  7.2  6.5   Total Bilirubin 0.3 - 1.2 mg/dL 0.2  0.4  0.4   Alkaline Phos 38 - 126 U/L 70  68  56   AST 15 - 41 U/L 24  21  16   ALT 0 - 44 U/L 15  13  10     RADIOGRAPHIC STUDIES: I have personally reviewed the radiological images as listed and agreed with the findings in the report: response to radiation therapy with improved nodules.   No results found.  ASSESSMENT & PLAN Robin Bryant 70 y.o. female with medical history significant for adenocarcinoma of the lung stage Ia who presents for a follow up visit.  Regarding her prior history, per Dr. Mohamed's Note: " CT scan of the chest on March 05, 2021 and it showed bilateral pulmonary nodules measuring up to 1.8 x 2.3 cm in the medial right lower lobe.  There was also subpleural scarring in the peripheral left lower lobe.  A PET scan was performed on April 16, 2021 and that showed no thoracic nodal hypermetabolism.  A left   upper lobe pulmonary nodule measured 0.8 cm with SUV of 2.6.  More anterior left upper lobe pulmonary nodule measured 1.0 cm with SUV of 4.6.  A lingular nodule measuring 0.6 cm with SUV of 1.6.  There was also a pleural-based right lower lobe dominant irregular pulmonary nodule measuring 1.8 x 1.6 with SUV max of 9.0.  There was no evidence for extrathoracic hypermetabolic metastasis.  The patient was referred to Dr. Icard and CT  super D of the chest was performed on May 11, 2021 for preparation of the navigational bronchoscopy.  On May 15, 2021 the patient underwent video bronchoscopy with robotic assisted bronchoscopic navigation under the care of Dr. Icard.  The final cytology ([id number]) of the right lower lobe brushing and fine-needle aspiration showed malignant cells consistent with adenocarcinoma.  The fine-needle aspiration as well as the brushing of the left upper lobe lung nodules showed atypical cells present. The patient was referred to Dr. Kinard for consideration of SBRT. "   # Adenocarcinoma of the Lung, Stage Ia (T1b, N0, M0) # Recurrent in Pleural Effusion, Adenocarcinoma of the Lung Stage IV --Unfortunately cytology on her pleural effusion was consistent with recurrence of her lung cancer with the presence of adenocarcinoma, consistent with lung cancer. Plan: --We will proceed with Tagrisso 80 mg p.o. daily given her mutation EGFR L858R -- Patient has a PD-L1 of 0%, monotherapy pembrolizumab would not be preferred at this time. -- We will repeat CT chest abdomen pelvis as she is due and it would be better to get a baseline prior to the start of this new therapy. --Labs today show white blood cell count 10.0, hemoglobin 14.1, MCV 97.9, and platelets of 243 -- Return to clinic approximately 4 weeks after the start of this therapy.  #Thyroid Lesion -- Currently under evaluation by a physician in [place].  The patient reports that she would like her follow-up regarding this lesion with them.  Orders Placed This Encounter  Procedures   CT CHEST ABDOMEN PELVIS W CONTRAST    Standing Status:   Future    Standing Expiration Date:   03/19/2023    Order Specific Question:   Preferred imaging location?    Answer:   [facility]    Order Specific Question:   Is Oral Contrast requested for this exam?    Answer:   Yes, Per Radiology protocol   All questions were answered. The  patient knows to call the clinic with any problems, questions or concerns.  A total of more than 30 minutes were spent on this encounter with face-to-face time and non-face-to-face time, including preparing to see the patient, ordering tests and/or medications, counseling the patient and coordination of care as outlined above.   John T. Dorsey, MD Department of Hematology/Oncology [facility] Phone: [phone] Pager: [phone] Email: [email or web address]  03/18/2022 11:00 AM 

## 2022-03-18 NOTE — Telephone Encounter (Signed)
Oral Oncology Patient Advocate Encounter   Received notification that prior authorization for Tagrisso is required.   PA submitted on 03/18/2022 Key [id number] Status is pending     Lady Deutscher, CPhT-Adv Oncology Pharmacy Patient [place] Direct Number: [phone]  Fax: [phone]

## 2022-03-19 ENCOUNTER — Telehealth: Payer: Self-pay | Admitting: Pharmacy Technician

## 2022-03-19 ENCOUNTER — Other Ambulatory Visit (HOSPITAL_COMMUNITY): Payer: Self-pay

## 2022-03-19 LAB — GUARDANT 360

## 2022-03-19 MED ORDER — OSIMERTINIB MESYLATE 80 MG PO TABS: 80.0000 mg | ORAL_TABLET | Freq: Every day | ORAL | 3 refills | Status: DC

## 2022-03-19 NOTE — Telephone Encounter (Signed)
Oral Oncology Pharmacist Encounter  Patient approved for manufacturer assistance through [employer]. Tagrisso redirected to [facility] [employer] for dispensing.   Leron Croak, PharmD, BCPS, [facility] Hematology/Oncology Clinical Pharmacist [facility] and [place] [phone] 03/19/2022 2:52 PM

## 2022-03-19 NOTE — Telephone Encounter (Signed)
Oral Oncology Patient Advocate Encounter   Received notification that the application for assistance for Tagrisso through AZ[employer]Me has been approved.   [employer] phone number [phone].   Effective dates: 03/19/22 through 05/20/2023  I have spoken to the patient.  Lady Deutscher, CPhT-Adv Oncology Pharmacy Patient [place] Direct Number: [phone]  Fax: [phone]

## 2022-03-22 ENCOUNTER — Telehealth: Payer: Self-pay | Admitting: Hematology and Oncology

## 2022-03-22 ENCOUNTER — Other Ambulatory Visit: Payer: Self-pay | Admitting: *Deleted

## 2022-03-22 ENCOUNTER — Telehealth: Payer: Self-pay | Admitting: *Deleted

## 2022-03-22 DIAGNOSIS — C3490 Malignant neoplasm of unspecified part of unspecified bronchus or lung: Secondary | ICD-10-CM

## 2022-03-22 NOTE — Telephone Encounter (Signed)
See previous note

## 2022-03-22 NOTE — Telephone Encounter (Signed)
Received call back from pt's friend, Robin Bryant. Advised him of Robin Bryant's CT scan appt on 04/02/22; advised she did not need to come here on 03/25/22 for any appts; and confirmed her PCP is Robin Bryant, [place]. Advised that we need an EKG done prior to the start of her Tagrisso.  The EKG can be done in [place] as it is more convenient there. Mr. Robin Bryant states that office closes at noon on Fridays. Advised that I will send a fax to them to see on Monday, plus will call them on Monday to confirm EKG appt.  Mr. Robin Bryant voiced understanding.

## 2022-03-22 NOTE — Telephone Encounter (Signed)
Called 3x per 11/3 in basket message, called both numbers listed no answer on either number

## 2022-03-22 NOTE — Telephone Encounter (Signed)
TCT patient's friend Early Manus Rudd regarding appts for pt. No answer but was able to leave vm message for him to call back today before 4 pm. - pt does not need to come here on Monday, 03/25/22. That was a leftover appt. -her CT scan is now scheduled for 04/02/22 @3 :30 pm.  - we need the name of her PCP so an EKG can be done prior to starting her Tagrisso.

## 2022-03-25 ENCOUNTER — Inpatient Hospital Stay: Payer: Medicare Other

## 2022-03-25 ENCOUNTER — Inpatient Hospital Stay: Payer: Medicare Other | Admitting: Hematology and Oncology

## 2022-03-28 ENCOUNTER — Telehealth: Payer: Self-pay | Admitting: *Deleted

## 2022-03-28 NOTE — Telephone Encounter (Signed)
Received fax'd copy of pt's EKG. Oral chemo pharmacy, Leron Croak and Dr. Lorenso Courier made aware.

## 2022-03-28 NOTE — Telephone Encounter (Signed)
Oral Chemotherapy Pharmacist Encounter  Called and spoke with patient's friend, Robin Bryant. He stated that Robin Bryant has not received Tagrisso as of yet. Looking further into the [employer] tracking information, it appears that [employer] has tried to deliver to patient's home on 11/6, 11/7, 11/8, but since no one was home to sign for delivery they were unable to leave medication.  Mr. Robin Bryant is going to reach out to [facility] and see the best way to obtain the medication from them at this time. I let him know that per the tracking information, they had attempted delivering in the AM time frame from 9:30 AM - 10:30 AM.  Mr. Robin Bryant said the best time to talk with Robin Bryant is in the AM and advised me to call her home phone number on 03/29/22 at 9AM to discuss the logistics of the [place] and starting therapy.   EKG obtained from patient's PCP office in [place], [place]. Dr. Lorenso Courier OK with patient starting Tagrisso once she receives it. Qtc 430 ms.  Will follow up with patient 03/29/22.  Leron Croak, PharmD, BCPS, BCOP Hematology/Oncology Clinical Pharmacist Robin Bryant and [place] [phone] 03/28/2022 10:20 AM

## 2022-03-29 ENCOUNTER — Encounter: Payer: Self-pay | Admitting: Hematology and Oncology

## 2022-03-29 NOTE — Telephone Encounter (Signed)
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   Counseled patient on administration, dosing, side effects, monitoring, drug-food interactions, safe handling, storage, and disposal.  Patient will take Tagrisso 80 tablets, 1 tablet by mouth once daily, without regard to food.  Tagrisso start date: pending delivery from [employer] - patient knows she can start medication once she has it in hand as baseline EKG OK for treatment initiation per Dr. Lorenso Courier.  [employer] has attempted to deliver to patient's home this week, but has been unsuccessful due to someone needing to be home to sign for the package. I provided patient and Rodman Comp with [employer] tracking number [id number]) as well as phone number for [employer] [phone]).  Adverse effects include but are not limited to: diarrhea, mouth sores, decreased appetitie, fatigue, dry skin, rash, nail changes, altered cardiac conduction, and decreased blood counts or electrolytes.  Diarrhea: Patient will obtain Imodium (loperamide) to have on hand if they experience diarrhea. Patient knows to alert the office of 4 or more loose stools above baseline.  Reviewed with patient importance of keeping a medication schedule and plan for any missed doses. No barriers to medication adherence identified.  Medication reconciliation performed and medication/allergy list updated.  All questions answered.  Ms. Binette and Mr. Manus Rudd voiced understanding and appreciation.   Medication education handout placed in mail for patient. Patient knows to call the office with questions or concerns. Oral Chemotherapy Clinic phone number provided to patient.   Leron Croak, PharmD, BCPS, [facility] Hematology/Oncology Clinical Pharmacist [facility] and  [place] [phone] 03/29/2022 9:17 AM

## 2022-03-29 NOTE — Telephone Encounter (Signed)
Oral Chemotherapy Pharmacist Encounter   Received call from [facility], he was able to pick up Daisy for Ms. Vest from the [employer] center in [place], [place]. Ms. Aloi will take first dose of Tagrisso on 03/30/22.  Leron Croak, PharmD, BCPS, BCOP Hematology/Oncology Clinical Pharmacist [facility] and [place] [phone] 03/29/2022 1:24 PM

## 2022-04-02 ENCOUNTER — Ambulatory Visit (HOSPITAL_COMMUNITY)
Admission: RE | Admit: 2022-04-02 | Discharge: 2022-04-02 | Disposition: A | Discharge status: Home / Self Care | Payer: Medicare Other | Source: Ambulatory Visit | Attending: Hematology and Oncology | Admitting: Hematology and Oncology

## 2022-04-02 ENCOUNTER — Encounter (HOSPITAL_COMMUNITY): Payer: Self-pay | Admitting: Radiology

## 2022-04-02 DIAGNOSIS — C3490 Malignant neoplasm of unspecified part of unspecified bronchus or lung: Secondary | ICD-10-CM | POA: Insufficient documentation

## 2022-04-02 MED ORDER — IOHEXOL 300 MG/ML  SOLN
80.0000 mL | Freq: Once | INTRAMUSCULAR | Status: AC | PRN
  Administered 2022-04-02: 80 mL via INTRAVENOUS

## 2022-04-04 ENCOUNTER — Other Ambulatory Visit (HOSPITAL_COMMUNITY): Payer: Self-pay

## 2022-04-04 ENCOUNTER — Telehealth: Payer: Self-pay | Admitting: *Deleted

## 2022-04-04 NOTE — Telephone Encounter (Signed)
-----   Message from Orson Slick, MD sent at 04/04/2022  9:39 AM EST ----- Please let Robin Bryant know that we have obtained her new baseline CT scan. No major changes form the last scan (just slightly more fluid in the lung). Please assure she has started her Tagrisso. We will see her back in December.   ----- Message ----- From: Interface, Rad Results In Sent: 04/03/2022   2:58 PM EST To: Orson Slick, MD

## 2022-04-04 NOTE — Telephone Encounter (Signed)
TCT pt's companion, Robin Bryant regarding Robin Bryant's recent CT scan.  Spoke ith him and advised  that there are no major changes as compared to her last scan. She does have slightly more fluid around her lung. He voiced understanding. Advised to call if she experiences any increased shortness of breath. Asked Robin if [name] is taking her Tagrisso. He states she is. She been taking it almost a week. She does c/o bad headache after she takes it. She is taking Ibuprofen for this. She takes Hydrocodone/apap  on a regular basis for her back pain. She is aware of her next appts on 04/22/22

## 2022-04-22 ENCOUNTER — Inpatient Hospital Stay (HOSPITAL_BASED_OUTPATIENT_CLINIC_OR_DEPARTMENT_OTHER): Payer: Medicare Other | Admitting: Hematology and Oncology

## 2022-04-22 ENCOUNTER — Emergency Department (HOSPITAL_COMMUNITY): Payer: Medicare Other

## 2022-04-22 ENCOUNTER — Other Ambulatory Visit: Payer: Self-pay

## 2022-04-22 ENCOUNTER — Observation Stay (HOSPITAL_COMMUNITY)
Admission: EM | Admit: 2022-04-22 | Discharge: 2022-04-23 | Disposition: A | Discharge status: Home / Self Care | Payer: Medicare Other | Attending: Internal Medicine | Admitting: Internal Medicine

## 2022-04-22 ENCOUNTER — Telehealth: Payer: Self-pay | Admitting: *Deleted

## 2022-04-22 ENCOUNTER — Other Ambulatory Visit: Payer: Self-pay | Admitting: *Deleted

## 2022-04-22 ENCOUNTER — Encounter (HOSPITAL_COMMUNITY): Payer: Self-pay

## 2022-04-22 ENCOUNTER — Other Ambulatory Visit: Payer: Self-pay | Admitting: Hematology and Oncology

## 2022-04-22 ENCOUNTER — Inpatient Hospital Stay: Payer: Medicare Other | Attending: Hematology and Oncology

## 2022-04-22 VITALS — BP 166/78 | HR 81 | Temp 98.2°F | Resp 16 | Wt 94.6 lb

## 2022-04-22 DIAGNOSIS — Z1152 Encounter for screening for COVID-19: Secondary | ICD-10-CM | POA: Insufficient documentation

## 2022-04-22 DIAGNOSIS — R0602 Shortness of breath: Secondary | ICD-10-CM | POA: Diagnosis present

## 2022-04-22 DIAGNOSIS — Z87891 Personal history of nicotine dependence: Secondary | ICD-10-CM | POA: Insufficient documentation

## 2022-04-22 DIAGNOSIS — E032 Hypothyroidism due to medicaments and other exogenous substances: Secondary | ICD-10-CM

## 2022-04-22 DIAGNOSIS — J9601 Acute respiratory failure with hypoxia: Secondary | ICD-10-CM | POA: Diagnosis not present

## 2022-04-22 DIAGNOSIS — C3490 Malignant neoplasm of unspecified part of unspecified bronchus or lung: Secondary | ICD-10-CM

## 2022-04-22 DIAGNOSIS — Z66 Do not resuscitate: Secondary | ICD-10-CM | POA: Insufficient documentation

## 2022-04-22 DIAGNOSIS — I10 Essential (primary) hypertension: Secondary | ICD-10-CM | POA: Insufficient documentation

## 2022-04-22 DIAGNOSIS — Z923 Personal history of irradiation: Secondary | ICD-10-CM | POA: Insufficient documentation

## 2022-04-22 DIAGNOSIS — J910 Malignant pleural effusion: Secondary | ICD-10-CM | POA: Diagnosis not present

## 2022-04-22 DIAGNOSIS — J90 Pleural effusion, not elsewhere classified: Secondary | ICD-10-CM | POA: Diagnosis present

## 2022-04-22 DIAGNOSIS — C3431 Malignant neoplasm of lower lobe, right bronchus or lung: Secondary | ICD-10-CM | POA: Diagnosis not present

## 2022-04-22 HISTORY — DX: Malignant (primary) neoplasm, unspecified: C80.1

## 2022-04-22 LAB — PROTIME-INR
INR: 1.2 (ref 0.8–1.2)
Prothrombin Time: 14.9 seconds (ref 11.4–15.2)

## 2022-04-22 LAB — CMP (CANCER CENTER ONLY)
ALT: 11 U/L (ref 0–44)
AST: 24 U/L (ref 15–41)
Albumin: 3.7 g/dL (ref 3.5–5.0)
Alkaline Phosphatase: 66 U/L (ref 38–126)
Anion gap: 7 (ref 5–15)
BUN: 26 mg/dL — ABNORMAL HIGH (ref 8–23)
CO2: 32 mmol/L (ref 22–32)
Calcium: 9.5 mg/dL (ref 8.9–10.3)
Chloride: 101 mmol/L (ref 98–111)
Creatinine: 1.15 mg/dL — ABNORMAL HIGH (ref 0.44–1.00)
GFR, Estimated: 50 mL/min — ABNORMAL LOW (ref >60)
Glucose, Bld: 116 mg/dL — ABNORMAL HIGH (ref 70–99)
Potassium: 4.6 mmol/L (ref 3.5–5.1)
Sodium: 140 mmol/L (ref 135–145)
Total Bilirubin: 0.4 mg/dL (ref 0.3–1.2)
Total Protein: 7.3 g/dL (ref 6.5–8.1)

## 2022-04-22 LAB — CBC WITH DIFFERENTIAL (CANCER CENTER ONLY)
Abs Immature Granulocytes: 0.02 10*3/uL (ref 0.00–0.07)
Basophils Absolute: 0.1 10*3/uL (ref 0.0–0.1)
Basophils Relative: 1 %
Eosinophils Absolute: 0.1 10*3/uL (ref 0.0–0.5)
Eosinophils Relative: 1 %
HCT: 38.9 % (ref 36.0–46.0)
Hemoglobin: 12.7 g/dL (ref 12.0–15.0)
Immature Granulocytes: 0 %
Lymphocytes Relative: 7 %
Lymphs Abs: 0.6 10*3/uL — ABNORMAL LOW (ref 0.7–4.0)
MCH: 32 pg (ref 26.0–34.0)
MCHC: 32.6 g/dL (ref 30.0–36.0)
MCV: 98 fL (ref 80.0–100.0)
Monocytes Absolute: 0.9 10*3/uL (ref 0.1–1.0)
Monocytes Relative: 11 %
Neutro Abs: 6.9 10*3/uL (ref 1.7–7.7)
Neutrophils Relative %: 80 %
Platelet Count: 205 10*3/uL (ref 150–400)
RBC: 3.97 MIL/uL (ref 3.87–5.11)
RDW: 15 % (ref 11.5–15.5)
WBC Count: 8.6 10*3/uL (ref 4.0–10.5)
nRBC: 0 % (ref 0.0–0.2)

## 2022-04-22 LAB — CREATININE, SERUM
Creatinine, Ser: 1.08 mg/dL — ABNORMAL HIGH (ref 0.44–1.00)
GFR, Estimated: 54 mL/min — ABNORMAL LOW (ref >60)

## 2022-04-22 LAB — CBC
HCT: 40.2 % (ref 36.0–46.0)
Hemoglobin: 12.5 g/dL (ref 12.0–15.0)
MCH: 31.6 pg (ref 26.0–34.0)
MCHC: 31.1 g/dL (ref 30.0–36.0)
MCV: 101.5 fL — ABNORMAL HIGH (ref 80.0–100.0)
Platelets: 206 10*3/uL (ref 150–400)
RBC: 3.96 MIL/uL (ref 3.87–5.11)
RDW: 14.9 % (ref 11.5–15.5)
WBC: 8.4 10*3/uL (ref 4.0–10.5)
nRBC: 0 % (ref 0.0–0.2)

## 2022-04-22 LAB — RESP PANEL BY RT-PCR (FLU A&B, COVID) ARPGX2
Influenza A by PCR: NEGATIVE
Influenza B by PCR: NEGATIVE
SARS Coronavirus 2 by RT PCR: NEGATIVE

## 2022-04-22 LAB — TSH: TSH: 9.074 u[IU]/mL — ABNORMAL HIGH (ref 0.350–4.500)

## 2022-04-22 LAB — TROPONIN I (HIGH SENSITIVITY)
Troponin I (High Sensitivity): 16 ng/L (ref <18)
Troponin I (High Sensitivity): 13 ng/L (ref <18)

## 2022-04-22 LAB — LACTATE DEHYDROGENASE: LDH: 391 U/L — ABNORMAL HIGH (ref 98–192)

## 2022-04-22 MED ORDER — GABAPENTIN 400 MG PO CAPS
800.0000 mg | ORAL_CAPSULE | Freq: Four times a day (QID) | ORAL | Status: DC
  Administered 2022-04-22 – 2022-04-23 (×2): 800 mg via ORAL
  Filled 2022-04-22 (×2): qty 2

## 2022-04-22 MED ORDER — SENNOSIDES-DOCUSATE SODIUM 8.6-50 MG PO TABS: 1.0000 | ORAL_TABLET | Freq: Every evening | ORAL | Status: DC | PRN

## 2022-04-22 MED ORDER — ACETAMINOPHEN 650 MG RE SUPP: 650.0000 mg | Freq: Four times a day (QID) | RECTAL | Status: DC | PRN

## 2022-04-22 MED ORDER — METOPROLOL SUCCINATE ER 50 MG PO TB24
100.0000 mg | ORAL_TABLET | Freq: Every day | ORAL | Status: DC
  Administered 2022-04-23: 100 mg via ORAL
  Filled 2022-04-22: qty 2

## 2022-04-22 MED ORDER — OSIMERTINIB MESYLATE 80 MG PO TABS: 80.0000 mg | ORAL_TABLET | Freq: Every day | ORAL | Status: DC

## 2022-04-22 MED ORDER — MAGIC MOUTHWASH W/LIDOCAINE: 5.0000 mL | Freq: Four times a day (QID) | ORAL | 2 refills | Status: DC

## 2022-04-22 MED ORDER — CLONIDINE HCL 0.1 MG PO TABS: 0.1000 mg | ORAL_TABLET | ORAL | Status: DC | PRN

## 2022-04-22 MED ORDER — ENOXAPARIN SODIUM 40 MG/0.4ML IJ SOSY: 40.0000 mg | PREFILLED_SYRINGE | INTRAMUSCULAR | Status: DC

## 2022-04-22 MED ORDER — MORPHINE SULFATE (PF) 2 MG/ML IV SOLN: 1.0000 mg | INTRAVENOUS | Status: DC | PRN

## 2022-04-22 MED ORDER — LEVOTHYROXINE SODIUM 25 MCG PO TABS
25.0000 ug | ORAL_TABLET | Freq: Every day | ORAL | Status: DC
  Administered 2022-04-23: 25 ug via ORAL
  Filled 2022-04-22: qty 1

## 2022-04-22 MED ORDER — ACETAMINOPHEN 325 MG PO TABS: 650.0000 mg | ORAL_TABLET | Freq: Four times a day (QID) | ORAL | Status: DC | PRN

## 2022-04-22 MED ORDER — MORPHINE SULFATE ER 15 MG PO TBCR
15.0000 mg | EXTENDED_RELEASE_TABLET | Freq: Every day | ORAL | Status: DC | PRN
  Administered 2022-04-23: 15 mg via ORAL
  Filled 2022-04-22: qty 1

## 2022-04-22 MED ORDER — AMLODIPINE BESYLATE 5 MG PO TABS
10.0000 mg | ORAL_TABLET | Freq: Every day | ORAL | Status: DC
  Administered 2022-04-23: 10 mg via ORAL
  Filled 2022-04-22: qty 2

## 2022-04-22 NOTE — ED Provider Notes (Signed)
[facility] DEPT Provider Note   CSN: [id number] Arrival date & time: 04/22/22  1650     History {Add pertinent medical, surgical, social history, OB history to HPI:1} Chief Complaint  Patient presents with   Chest Pain   Shortness of Breath    [facility]in [facility] is a 71 y.o. female.  She has a history of lung cancer and follows with Dr. Lorenso Courier.  She is on oral immunotherapy.  She had a routine appointment today and endorsed some shortness of breath over the last few weeks.  They did a trending pulse ox and found that she dropped to 77% with exertion.  She does not normally require oxygen.  She said she has had a pleural effusion that has required drainage in the past, last time in September, and feels like that.  Cough productive of some clear sputum.  Has some tightness in her chest with exertion sometimes.  Has felt feverish recently although has not checked her temperature.  Generally poor appetite.  She does endorse some leg swelling.  The history is provided by the patient.  Shortness of Breath Severity:  Moderate Onset quality:  Gradual Duration:  2 weeks Timing:  Intermittent Progression:  Worsening Chronicity:  Recurrent Context: activity   Relieved by:  Rest Worsened by:  Activity Ineffective treatments:  Rest Associated symptoms: chest pain, cough, fever and sputum production   Associated symptoms: no abdominal pain, no hemoptysis and no vomiting   Risk factors: hx of cancer        Home Medications Prior to Admission medications   Medication Sig Start Date End Date Taking? Authorizing Provider  amLODipine (NORVASC) 5 MG tablet Take 2 tablets (10 mg total) by mouth daily. 12/24/21   Orson Slick, MD  aspirin EC 81 MG tablet Take 81 mg by mouth every other day. Swallow whole.    [provider]  Biotin 5000 MCG TABS Take 5,000 mcg by mouth daily.    [provider]  Calcium Carb-Cholecalciferol (OYSTER SHELL CALCIUM  W/D) 500-5 MG-MCG TABS Take 1 tablet by mouth 2 (two) times daily. 06/12/21   [provider]  Cholecalciferol (VITAMIN D-3) 125 MCG (5000 UT) TABS Take 5,000 Units by mouth daily.    [provider]  EUTHYROX 50 MCG tablet Take 50 mcg by mouth every morning. 01/31/21   [provider]  fluticasone (FLONASE) 50 MCG/ACT nasal spray Place 1 spray into both nostrils daily.    [provider]  gabapentin (NEURONTIN) 800 MG tablet Take 800 mg by mouth 4 (four) times daily. 02/13/21   [provider]  HYDROcodone-acetaminophen (NORCO) 10-325 MG tablet Take 1 tablet by mouth 4 (four) times daily as needed for moderate pain or severe pain. 03/14/21   [provider]  magic mouthwash w/lidocaine SOLN Take 5 mLs by mouth 4 (four) times daily. Viscous Lidocaine 80 ml; Diphenhydramine 80 mls; Maalox 80 mls 04/22/22   Orson Slick, MD  metoprolol succinate (TOPROL-XL) 25 MG 24 hr tablet Take 25 mg by mouth daily. 03/12/21   [provider]  morphine (MS CONTIN) 15 MG 12 hr tablet Take 1 tablet (15 mg total) by mouth every 12 (twelve) hours. 03/18/22   Orson Slick, MD  osimertinib mesylate (TAGRISSO) 80 MG tablet Take 1 tablet (80 mg total) by mouth daily. 03/19/22   Orson Slick, MD  Polyethylene Glycol 400 (BLINK TEARS) 0.25 % SOLN Place 1 drop into both eyes daily.  [provider]  tretinoin (RETIN-A) 0.05 % cream Apply topically daily. 07/16/21   [provider]  vitamin B-12 (CYANOCOBALAMIN) 1000 MCG tablet Take 1,000 mcg by mouth daily.    [provider]      Allergies    Influenza vaccines and Other    Review of Systems   Review of Systems  Constitutional:  Positive for fever.  Respiratory:  Positive for cough, sputum production and shortness of breath. Negative for hemoptysis.   Cardiovascular:  Positive for chest pain and leg swelling.  Gastrointestinal:  Negative for abdominal pain and  vomiting.    Physical Exam Updated Vital Signs BP (!) 186/92 (BP Location: Left Arm)   Pulse 78   Temp 98.3 F (36.8 C) (Oral)   Resp (!) 21   Ht 5' 9.5" (1.765 m)   Wt 42.9 kg   SpO2 99%   BMI 13.77 kg/m  Physical Exam Vitals and nursing note reviewed.  Constitutional:      General: She is not in acute distress.    Appearance: She is well-developed.  HENT:     Head: Normocephalic and atraumatic.  Eyes:     Conjunctiva/sclera: Conjunctivae normal.  Cardiovascular:     Rate and Rhythm: Normal rate and regular rhythm.     Heart sounds: No murmur heard. Pulmonary:     Effort: Pulmonary effort is normal. No respiratory distress.     Breath sounds: Decreased breath sounds and rhonchi present.  Abdominal:     Palpations: Abdomen is soft.     Tenderness: There is no abdominal tenderness.  Musculoskeletal:        General: No swelling.     Cervical back: Neck supple.     Right lower leg: No tenderness. Edema present.     Left lower leg: No tenderness. Edema present.  Skin:    General: Skin is warm and dry.     Capillary Refill: Capillary refill takes less than 2 seconds.  Neurological:     General: No focal deficit present.     Mental Status: She is alert.     ED Results / Procedures / Treatments   Labs (all labs ordered are listed, but only abnormal results are displayed) Labs Reviewed - No data to display  EKG None  Radiology No results found.  Procedures Procedures  {Document cardiac monitor, telemetry assessment procedure when appropriate:1}  Medications Ordered in ED Medications - No data to display  ED Course/ Medical Decision Making/ A&P                           Medical Decision Making Amount and/or Complexity of Data Reviewed Labs: ordered. Radiology: ordered.   This patient complains of ***; this involves an extensive number of treatment Options and is a complaint that carries with it a high risk of complications and morbidity. The  differential includes ***  I ordered, reviewed and interpreted labs, which included *** I ordered medication *** and reviewed PMP when indicated. I ordered imaging studies which included *** and I independently    visualized and interpreted imaging which showed *** Additional history obtained from *** Previous records obtained and reviewed *** I consulted *** and discussed lab and imaging findings and discussed disposition.  Cardiac monitoring reviewed, *** Social determinants considered, *** Critical Interventions: ***  After the interventions stated above, I reevaluated the patient and found *** Admission and further testing considered, ***   {Document critical care time  when appropriate:1} {Document review of labs and clinical decision tools ie heart score, Chads2Vasc2 etc:1}  {Document your independent review of radiology images, and any outside records:1} {Document your discussion with family members, caretakers, and with consultants:1} {Document social determinants of health affecting pt's care:1} {Document your decision making why or why not admission, treatments were needed:1} Final Clinical Impression(s) / ED Diagnoses Final diagnoses:  None    Rx / DC Orders ED Discharge Orders     None

## 2022-04-22 NOTE — ED Triage Notes (Signed)
Per [facility] Nurse- Patient  was at the Cancer today and while ambulating Sats decreased to 77%. Patient was placed on O2 2L/min via [place] and sats increased to 98-100%. Patient also c/o intermittent chest pressure. Patient has a history of lung cancer and pleural effusion.

## 2022-04-22 NOTE — Progress Notes (Signed)
Pt c/o chest tightness, worse with ambulation. She states she feels like she can't breath when that happens. Checked 02 sats at rest and then with ambulation. S02 at rest was 94%. Ambulated approx 30 ft and 02 sat dropped to 77% and pt tachycardic @ 148. Pt placed back in w/c, returned to room and applied 02 @ 2l/min. S02 returned to 98-100 with the 02. Heart 78 with 02 and rest. Pt to be transported to ED with possible pleural effusion. She has had this in the past. Transported via w/c and 02 @ 2l/min. Delphina Cahill came with this RN and pt. Report given to RN in ED

## 2022-04-22 NOTE — ED Notes (Signed)
Received this patient from the [facility] via wheelchair. Received report from [facility], [place].

## 2022-04-22 NOTE — Telephone Encounter (Signed)
Faxed Rx for magic mouthwash to [facility] [phone]

## 2022-04-22 NOTE — H&P (Signed)
PCP:   Robin Books, NP   Chief Complaint:    HPI: This is a 71 year old female with past medical history of hypertension and lung cancer.  She was diagnosed with lung cancer 05/15/2021.  She has completed radiation therapy.  She is on oral chemotherapy to [place], which her hematologist is discontinuing.  She had a right-sided pleural effusion and thoracentesis once.  While at her routine oncologist visit today, she told them her lungs felt also lower from the operative fluid again.  She was walked and her oxygen dropped into the 70s while ambulating.  She was sent to the ER.  In the ER CXR positive for large onset right-sided pleural effusion  Review of Systems:  The patient denies anorexia, fever, weight loss,, vision loss, decreased hearing, hoarseness, chest pain, syncope, dyspnea on exertion, peripheral edema, balance deficits, hemoptysis, abdominal pain, melena, hematochezia, severe indigestion/heartburn, hematuria, incontinence, genital sores, muscle weakness, suspicious skin lesions, transient blindness, difficulty walking, depression, unusual weight change, abnormal bleeding, enlarged lymph nodes, angioedema, and breast masses. Positives:  Past Medical History: Past Medical History:  Diagnosis Date   Back pain    Cancer ([facility])    History of radiation therapy    Left Lung, Right Lung 06/27/21-07/10/21- Dr. Gery [facility]   HTN (hypertension)    Scoliosis    Sinus congestion    Past Surgical History:  Procedure Laterality Date   ABDOMINAL HYSTERECTOMY     BACK SURGERY     BRONCHIAL BIOPSY  05/15/2021   Procedure: BRONCHIAL BIOPSIES;  Surgeon: Garner Nash, DO;  Location: [facility];  Service: Pulmonary;;   BRONCHIAL BRUSHINGS  05/15/2021   Procedure: BRONCHIAL BRUSHINGS;  Surgeon: Garner Nash, DO;  Location: [facility];  Service: Pulmonary;;   BRONCHIAL NEEDLE ASPIRATION BIOPSY  05/15/2021   Procedure: BRONCHIAL NEEDLE ASPIRATION BIOPSIES;  Surgeon:  Garner Nash, DO;  Location: [facility];  Service: Pulmonary;;   BRONCHIAL WASHINGS  05/15/2021   Procedure: BRONCHIAL WASHINGS;  Surgeon: Garner Nash, DO;  Location: [facility];  Service: Pulmonary;;   VIDEO BRONCHOSCOPY WITH RADIAL ENDOBRONCHIAL ULTRASOUND  05/15/2021   Procedure: RADIAL ENDOBRONCHIAL ULTRASOUND;  Surgeon: Garner Nash, DO;  Location: [facility];  Service: Pulmonary;;    Medications: Prior to Admission medications   Medication Sig Start Date End Date Taking? Authorizing Provider  aspirin EC 81 MG tablet Take 81 mg by mouth every other day. Swallow whole.   Yes [provider]  Biotin 5000 MCG TABS Take 5,000 mcg by mouth daily.   Yes [provider]  Cholecalciferol (VITAMIN D-3) 125 MCG (5000 UT) TABS Take 5,000 Units by mouth daily.   Yes [provider]  fluticasone (FLONASE) 50 MCG/ACT nasal spray Place 1 spray into both nostrils daily as needed for allergies.   Yes [provider]  gabapentin (NEURONTIN) 800 MG tablet Take 800 mg by mouth 4 (four) times daily. 02/13/21  Yes [provider]  HYDROcodone-acetaminophen (NORCO) 10-325 MG tablet Take 1 tablet by mouth 4 (four) times daily as needed for moderate pain or severe pain. 03/14/21  Yes [provider]  levothyroxine (SYNTHROID) 25 MCG tablet Take 25 mcg by mouth daily before breakfast.   Yes [provider]  magic mouthwash w/lidocaine SOLN Take 5 mLs by mouth 4 (four) times daily. Viscous Lidocaine 80 ml; Diphenhydramine 80 mls; Maalox 80 mls 04/22/22  Yes Robin [facility] IV, MD  metoprolol succinate (TOPROL-XL) 100 MG 24 hr tablet Take 100 mg by  mouth daily. Take with or immediately following a meal.   Yes [provider]  morphine (MS CONTIN) 15 MG 12 hr tablet Take 1 tablet (15 mg total) by mouth every 12 (twelve) hours. Patient taking differently: Take 15 mg by mouth daily as needed for pain. 03/18/22  Yes Robin Slick,  MD  Multiple Vitamin (MULTIVITAMIN WITH MINERALS) TABS tablet Take 1 tablet by mouth daily.   Yes [provider]  osimertinib mesylate (TAGRISSO) 80 MG tablet Take 1 tablet (80 mg total) by mouth daily. Patient taking differently: Take 80 mg by mouth every evening. 03/19/22  Yes Robin Slick, MD  Polyethylene Glycol 400 (BLINK TEARS) 0.25 % SOLN Place 1 drop into both eyes daily as needed (For dry eyes).   Yes [provider]  tretinoin (RETIN-A) 0.05 % cream Apply 1 Application topically daily as needed (For rash). 07/16/21  Yes [provider]  vitamin B-12 (CYANOCOBALAMIN) 1000 MCG tablet Take 1,000 mcg by mouth daily.   Yes [provider]  amLODipine (NORVASC) 5 MG tablet Take 2 tablets (10 mg total) by mouth daily. Patient not taking: Reported on 04/22/2022 12/24/21   Robin Slick, MD    Allergies:   Allergies  Allergen Reactions   Influenza Vaccines Other (See Comments)    Sick, ended up in the ICU   Other Other (See Comments)    Flu Vaccine Sick    Social History:  reports that she quit smoking about 12 months ago. Her smoking use included cigarettes. She has a 25.00 pack-year smoking history. She has never used smokeless tobacco. She reports that she does not drink alcohol and does not use drugs.  Family History: Family History  Problem Relation Age of Onset   Alzheimer's disease Mother    Cancer Father    Lung cancer Father     Physical Exam: Vitals:   04/22/22 1800 04/22/22 1830 04/22/22 1900 04/22/22 1930  BP: (!) 172/81 (!) 180/84 (!) 174/92 (!) 184/88  Pulse: 82 70 73 75  Resp: (!) 23 (!) 23 17 (!) 21  Temp:      TempSrc:      SpO2: 92% 93% 96% 97%  Weight:      Height:        General:  Alert and oriented times three, cachectic frail female, no acute distress Eyes: PERRLA, pink conjunctiva, red rimmed eyes ENT: Moist oral mucosa, neck supple, no thyromegaly Lungs: No lung sounds on right, no wheeze, no crackles,  no use of accessory muscles Cardiovascular: regular rate and rhythm, no regurgitation, no gallops, no murmurs. No carotid bruits, no JVD Abdomen: soft, positive BS, non-tender, non-distended, no organomegaly, not an acute abdomen GU: not examined Neuro: CN II - XII grossly intact, sensation intact Musculoskeletal: strength 5/5 all extremities, no clubbing, cyanosis. 2+ bilateral lower extremity edema Skin: no rash, no subcutaneous crepitation, no decubitus Psych: appropriate patient   Labs on Admission:  Recent Labs    04/22/22 1357  NA 140  K 4.6  CL 101  CO2 32  GLUCOSE 116*  BUN 26*  CREATININE 1.15*  CALCIUM 9.5   Recent Labs    04/22/22 1357  AST 24  ALT 11  ALKPHOS 66  BILITOT 0.4  PROT 7.3  ALBUMIN 3.7    Recent Labs    04/22/22 1357  WBC 8.6  NEUTROABS 6.9  HGB 12.7  HCT 38.9  MCV 98.0  PLT 205    Recent Labs  04/22/22 1356  TSH 9.074*     Micro Results: Recent Results (from the past 240 hour(s))  Resp Panel by RT-PCR (Flu A&B, Covid) Anterior Nasal Swab     Status: None   Collection Time: 04/22/22  5:03 PM   Specimen: Anterior Nasal Swab  Result Value Ref Range Status   SARS Coronavirus 2 by RT PCR NEGATIVE NEGATIVE Final    Comment: (NOTE) SARS-CoV-2 target nucleic acids are NOT DETECTED.  The SARS-CoV-2 RNA is generally detectable in upper respiratory specimens during the acute phase of infection. The lowest concentration of SARS-CoV-2 viral copies this assay can detect is 138 copies/mL. A negative result does not preclude SARS-Cov-2 infection and should not be used as the sole basis for treatment or other patient management decisions. A negative result may occur with  improper specimen collection/handling, submission of specimen other than nasopharyngeal swab, presence of viral mutation(s) within the areas targeted by this assay, and inadequate number of viral copies(<138 copies/mL). A negative result must be combined with clinical  observations, patient history, and epidemiological information. The expected result is Negative.  Fact Sheet for Patients:  EntrepreneurPulse.com.au  Fact Sheet for Healthcare Providers:  IncredibleEmployment.be  This test is no t yet approved or cleared by the [place] FDA and  has been authorized for detection and/or diagnosis of SARS-CoV-2 by FDA under an Emergency Use Authorization (EUA). This EUA will remain  in effect (meaning this test can be used) for the duration of the COVID-19 declaration under Section 564(b)(1) of the Act, 21 U.S.C.section 360bbb-3(b)(1), unless the authorization is terminated  or revoked sooner.       Influenza A by PCR NEGATIVE NEGATIVE Final   Influenza B by PCR NEGATIVE NEGATIVE Final    Comment: (NOTE) The Xpert Xpress SARS-CoV-2/FLU/RSV plus assay is intended as an aid in the diagnosis of influenza from Nasopharyngeal swab specimens and should not be used as a sole basis for treatment. Nasal washings and aspirates are unacceptable for Xpert Xpress SARS-CoV-2/FLU/RSV testing.  Fact Sheet for Patients: EntrepreneurPulse.com.au  Fact Sheet for Healthcare Providers: IncredibleEmployment.be  This test is not yet approved or cleared by the [place] FDA and has been authorized for detection and/or diagnosis of SARS-CoV-2 by FDA under an Emergency Use Authorization (EUA). This EUA will remain in effect (meaning this test can be used) for the duration of the COVID-19 declaration under Section 564(b)(1) of the Act, 21 U.S.C. section 360bbb-3(b)(1), unless the authorization is terminated or revoked.  Performed at [facility], [place] [address], [place], [place] [address]      Radiological Exams on Admission: DG Chest Port 1 View  Result Date: 04/22/2022 CLINICAL DATA:  Shortness of breath. EXAM: PORTABLE CHEST 1 VIEW COMPARISON:  Chest radiograph  dated 02/04/2022 and CT dated 04/02/2022. FINDINGS: Large right pleural effusion with compressive atelectasis of the right lung base versus pneumonia. Diffuse interstitial prominence and linear densities which may represent edema, pneumonia, or developing radiation pneumonitis or fibrosis. No pneumothorax. Stable cardiomediastinal silhouette. Atherosclerotic calcification of the aorta. No acute osseous pathology. IMPRESSION: 1. Large right pleural effusion with compressive atelectasis of the right lung base versus pneumonia. 2. Progression of pulmonary interstitial densities since the prior radiograph. Electronically Signed   By: Anner Crete M.D.   On: 04/22/2022 17:55    Assessment/Plan Present on Admission:  Pleural effusion, malignant -Admit to MedSurg -Right-sided ultrasound-guided thoracentesis  -Oxygen if needed   Primary cancer of right lower lobe of lung [facility]) -Per oncology  Hypertension, uncontrolled -Resume home medication, as needed blood pressure medications  [facility] 04/22/2022, 7:46 PM

## 2022-04-22 NOTE — ED Notes (Signed)
Patient repositioned for comfort.  C/o pain to sacrum.  Sacrum padding placed and pillows added for comfort.

## 2022-04-22 NOTE — Progress Notes (Signed)
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 PDL1 0% 05/15/2021: bronchoscopy performed, biopsy consistent with adenocarcinoma of the lung.  06/18/2021: last visit with Dr. Julien Nordmann.  2/8-2/21/2023: SBRT with Dr. Sondra Come.  10/01/2021: CT Chest w contrast shows response to therapy of right lower and left upper lobe pulmonary nodules. 10/08/2021: establish care with Dr. Lorenso Courier  12/24/2021: CT C/A/P performed. Preliminary read shows increased pleural effusion but no overt evidence of recurrence.  02/04/2022: Thoracentesis performed, fluid consistent with adenocarcinoma of the lung.  03/18/2022: start of Tagrisso 80 mg PO daily. 04/22/2022: HOLD Tagrisso due to ocular, dermatological and oral toxicity. Transferred to ER due to concern for worsening pleural effusion.   Interval History:  Robin Bryant 71 y.o. female with medical history significant for adenocarcinoma of the lung stage Ia who presents for a follow up visit. The patient's last visit was on 03/18/2022. In the interim since the last visit she has continued on Progress therapy.  On exam today Mrs. Noa reports she has had issues with her eyes where she is having some double vision and swollen eyelids.  She is also having some confusion and "does not make sense sometimes".  She is also hearing sounds that are not present.  This started with the Howe therapy.  She notes that she developed sores in her mouth and gums as well as dry scaling skin and some swelling of her lips.  She also notes that she has developed some dry cracking skin on her backside.  She reports that she feels like she is having more trouble breathing than usual and is getting easily  short of breath.  She notes that initially when she started her Tagrisso she developed a headache but this has resolved.  She continues to have marked swelling of her lower extremities with +2 pitting edema.  Her bowels are moving regularly.  She otherwise denies any fevers, chills, sweats, nausea, vomiting or diarrhea.  Full 10 point ROS was otherwise negative.   MEDICAL HISTORY:  Past Medical History:  Diagnosis Date   Back pain    Cancer [facility])    History of radiation therapy    Left Lung, Right Lung 06/27/21-07/10/21- Dr. Gery Pray   HTN (hypertension)    Scoliosis    Sinus congestion     SURGICAL HISTORY: Past Surgical History:  Procedure Laterality Date   ABDOMINAL HYSTERECTOMY     BACK SURGERY     BRONCHIAL BIOPSY  05/15/2021   Procedure: BRONCHIAL BIOPSIES;  Surgeon: Garner Nash, DO;  Location: [facility];  Service: Pulmonary;;   BRONCHIAL BRUSHINGS  05/15/2021   Procedure: BRONCHIAL BRUSHINGS;  Surgeon: Garner Nash, DO;  Location: [facility];  Service: Pulmonary;;   BRONCHIAL NEEDLE ASPIRATION BIOPSY  05/15/2021   Procedure: BRONCHIAL NEEDLE ASPIRATION BIOPSIES;  Surgeon: Garner Nash, DO;  Location: [facility];  Service: Pulmonary;;   BRONCHIAL WASHINGS  05/15/2021   Procedure: BRONCHIAL WASHINGS;  Surgeon: Garner Nash, DO;  Location: [facility];  Service: Pulmonary;;   VIDEO BRONCHOSCOPY WITH RADIAL ENDOBRONCHIAL ULTRASOUND  05/15/2021   Procedure: RADIAL ENDOBRONCHIAL ULTRASOUND;  Surgeon: Garner Nash, DO;  Location: [facility];  Service: Pulmonary;;    SOCIAL HISTORY: Social History   Socioeconomic History   Marital  status: Divorced    Spouse name: Not on file   Number of children: Not on file   Years of education: Not on file   Highest education level: Not on file  Occupational History   Not on file  Tobacco Use   Smoking status: Former    Packs/day: 0.50    Years: 50.00    Total pack years: 25.00    Types: Cigarettes     Quit date: 04/04/2021    Years since quitting: 1.0   Smokeless tobacco: Never  Vaping Use   Vaping Use: Never used  Substance and Sexual Activity   Alcohol use: Never   Drug use: Never   Sexual activity: Not on file  Other Topics Concern   Not on file  Social History Narrative   Not on file   Social Determinants of Health   Financial Resource Strain: Not on file  Food Insecurity: Not on file  Transportation Needs: Not on file  Physical Activity: Not on file  Stress: Not on file  Social Connections: Not on file  Intimate Partner Violence: Not on file    FAMILY HISTORY: Family History  Problem Relation Age of Onset   Alzheimer's disease Mother    Cancer Father    Lung cancer Father     ALLERGIES:  is allergic to influenza vaccines and other.  MEDICATIONS:  Current Outpatient Medications  Medication Sig Dispense Refill   amLODipine (NORVASC) 5 MG tablet Take 2 tablets (10 mg total) by mouth daily. 60 tablet 1   aspirin EC 81 MG tablet Take 81 mg by mouth every other day. Swallow whole.     Biotin 5000 MCG TABS Take 5,000 mcg by mouth daily.     Calcium Carb-Cholecalciferol (OYSTER SHELL CALCIUM W/D) 500-5 MG-MCG TABS Take 1 tablet by mouth 2 (two) times daily.     Cholecalciferol (VITAMIN D-3) 125 MCG (5000 UT) TABS Take 5,000 Units by mouth daily.     EUTHYROX 50 MCG tablet Take 50 mcg by mouth every morning.     fluticasone (FLONASE) 50 MCG/ACT nasal spray Place 1 spray into both nostrils daily.     gabapentin (NEURONTIN) 800 MG tablet Take 800 mg by mouth 4 (four) times daily.     HYDROcodone-acetaminophen (NORCO) 10-325 MG tablet Take 1 tablet by mouth 4 (four) times daily as needed for moderate pain or severe pain.     magic mouthwash w/lidocaine SOLN Take 5 mLs by mouth 4 (four) times daily. Viscous Lidocaine 80 ml; Diphenhydramine 80 mls; Maalox 80 mls 240 mL 2   metoprolol succinate (TOPROL-XL) 25 MG 24 hr tablet Take 25 mg by mouth daily.     morphine (MS  CONTIN) 15 MG 12 hr tablet Take 1 tablet (15 mg total) by mouth every 12 (twelve) hours. 60 tablet 0   osimertinib mesylate (TAGRISSO) 80 MG tablet Take 1 tablet (80 mg total) by mouth daily. 30 tablet 3   Polyethylene Glycol 400 (BLINK TEARS) 0.25 % SOLN Place 1 drop into both eyes daily.     tretinoin (RETIN-A) 0.05 % cream Apply topically daily.     vitamin B-12 (CYANOCOBALAMIN) 1000 MCG tablet Take 1,000 mcg by mouth daily.     No current facility-administered medications for this visit.    REVIEW OF SYSTEMS:   Constitutional: ( - ) fevers, ( - )  chills , ( - ) night sweats Eyes: ( - ) blurriness of vision, ( - ) double vision, ( - ) watery eyes  Ears, nose, mouth, throat, and face: ( - ) mucositis, ( - ) sore throat Respiratory: ( - ) cough, ( - ) dyspnea, ( - ) wheezes Cardiovascular: ( - ) palpitation, ( - ) chest discomfort, ( - ) lower extremity swelling Gastrointestinal:  ( - ) nausea, ( - ) heartburn, ( - ) change in bowel habits Skin: ( - ) abnormal skin rashes Lymphatics: ( - ) new lymphadenopathy, ( - ) easy bruising Neurological: ( - ) numbness, ( - ) tingling, ( - ) new weaknesses Behavioral/Psych: ( - ) mood change, ( - ) new changes  All other systems were reviewed with the patient and are negative.  PHYSICAL EXAMINATION: ECOG PERFORMANCE STATUS: 1 - Symptomatic but completely ambulatory  Vitals:   04/22/22 1510  BP: (!) 166/78  Pulse: 81  Resp: 16  Temp: 98.2 F (36.8 C)  SpO2: 95%    Filed Weights   04/22/22 1510  Weight: 94 lb 9.6 oz (42.9 kg)     GENERAL: Well-appearing middle-age Caucasian female.  Quite thin.  Alert, no distress and comfortable MOUTH: white plaque on tongue, mouth sores evident.  SKIN: skin color, texture, turgor are normal, no rashes or significant lesions EYES: some redness/swelling of eyelids.conjunctiva are pink and non-injected, sclera clear LUNGS: clear to auscultation and percussion with normal breathing effort HEART:  regular rate & rhythm and no murmurs and +2 bilateral lower extremity edema Musculoskeletal: no cyanosis of digits and no clubbing  PSYCH: alert & oriented x 3, fluent speech NEURO: no focal motor/sensory deficits  LABORATORY DATA:  I have reviewed the data as listed    Latest Ref Rng & Units 04/22/2022    1:57 PM 03/18/2022   10:31 AM 02/22/2022    1:46 PM  CBC  WBC 4.0 - 10.5 K/uL 8.6  10.0  12.2   Hemoglobin 12.0 - 15.0 g/dL 12.7  14.1  14.0   Hematocrit 36.0 - 46.0 % 38.9  42.4  42.1   Platelets 150 - 400 K/uL 205  243  346        Latest Ref Rng & Units 04/22/2022    1:57 PM 03/18/2022   10:31 AM 02/22/2022    1:46 PM  CMP  Glucose 70 - 99 mg/dL 116  96  115   BUN 8 - 23 mg/dL _0 Creatinine 0.44 - 1.00 mg/dL 1.15  0.92  0.89   Sodium 135 - 145 mmol/L 140  142  142   Potassium 3.5 - 5.1 mmol/L 4.6  4.4  4.9   Chloride 98 - 111 mmol/L 101  104  106   CO2 22 - 32 mmol/L 32  34  33   Calcium 8.9 - 10.3 mg/dL 9.5  9.5  8.9   Total Protein 6.5 - 8.1 g/dL 7.3  6.8  6.7   Total Bilirubin 0.3 - 1.2 mg/dL 0.4  0.3  0.2   Alkaline Phos 38 - 126 U/L 66  61  70   AST 15 - 41 U/L _1 ALT 0 - 44 U/L _2 RADIOGRAPHIC STUDIES: I have personally reviewed the radiological images as listed and agreed with the findings in the report: response to radiation therapy with improved nodules.   CT CHEST ABDOMEN PELVIS W CONTRAST  Result Date: 04/03/2022 CLINICAL DATA:  Metastatic lung cancer restaging * Tracking Code: BO * EXAM: CT CHEST, ABDOMEN, AND  PELVIS WITH CONTRAST TECHNIQUE: Multidetector CT imaging of the chest, abdomen and pelvis was performed following the standard protocol during bolus administration of intravenous contrast. RADIATION DOSE REDUCTION: This exam was performed according to the departmental dose-optimization program which includes automated exposure control, adjustment of the mA and/or kV according to patient size and/or use of iterative  reconstruction technique. CONTRAST:  2m OMNIPAQUE IOHEXOL 300 MG/ML  SOLN COMPARISON:  12/24/2021 FINDINGS: CT CHEST FINDINGS Cardiovascular: Aortic atherosclerosis. Normal heart size. Left coronary artery calcifications no pericardial effusion. Mediastinum/Nodes: No enlarged mediastinal, hilar, or axillary lymph nodes. Unchanged hypodense nodule of the inferior pole of the left lobe of the thyroid measuring 1.9 x 1.4 cm (series 2, image 4). The right lobe of the thyroid is atrophic or surgically absent. Trachea, and esophagus demonstrate no significant findings. Lungs/Pleura: Large right pleural effusion with associated atelectasis or consolidation, slightly increased compared to prior examination. Unchanged hypodense lesion of the medial right lower lobe measuring 1.4 x 1.1 cm, predominantly obscured by effusion and atelectasis (series 2, image 40). Increase in heterogeneous airspace opacity and consolidation of the anterior lingula, which now obscures a previously seen small nodule (series 3, image 69). Biapical pleuroparenchymal scarring. Unchanged bronchiolar plugging, scarring, and nodularity of the medial segment right middle lobe and lingula (series 6, image 90). Moderate underlying centrilobular emphysema. Musculoskeletal: No chest wall abnormality. No acute osseous findings. CT ABDOMEN PELVIS FINDINGS Hepatobiliary: No solid liver abnormality is seen. No gallstones, gallbladder wall thickening, or biliary dilatation. Pancreas: Unremarkable. No pancreatic ductal dilatation or surrounding inflammatory changes. Spleen: Normal in size without significant abnormality. Adrenals/Urinary Tract: Adrenal glands are unremarkable. Unchanged subcentimeter low-attenuation lesions of the kidneys, too small to characterize although again almost certainly benign simple cysts, for which no further follow-up or characterization is required. Kidneys are otherwise normal, without renal calculi, solid lesion, or  hydronephrosis. Bladder is unremarkable. Stomach/Bowel: Stomach is within normal limits. Appendix appears normal. No evidence of bowel wall thickening, distention, or inflammatory changes. Sigmoid diverticula. Vascular/Lymphatic: Aortic atherosclerosis. No enlarged abdominal or pelvic lymph nodes. Reproductive: No mass or other abnormality. Other: No abdominal wall hernia or abnormality. No ascites. Musculoskeletal: No acute osseous findings. Levoscoliosis of the thoracolumbar spine. IMPRESSION: 1. Large right pleural effusion with associated atelectasis or consolidation, slightly increased compared to prior examination. 2. Unchanged hypodense lesion of the medial right lower lobe, predominantly obscured by effusion and atelectasis. 3. Increase in heterogeneous airspace opacity and consolidation of the anterior lingula, which now obscures a previously seen small nodule, consistent with developing radiation pneumonitis and fibrosis. 4. 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 5. No evidence of lymphadenopathy or metastatic disease in the abdomen or pelvis. 6. Unchanged hypodense nodule of the inferior pole of the left lobe of the thyroid measuring 1.9 cm. In the setting of significant comorbidities or limited life expectancy, no follow-up recommended (ref: J Am Coll Radiol. 2015 Feb;12(2): 143-50). (ref: J Am Coll Radiol. 2015 Feb;12(2): 143-50). 7. Emphysema. Aortic Atherosclerosis (ICD10-I70.0) and Emphysema (ICD10-J43.9). Electronically Signed   By: ADelanna AhmadiM.D.   On: 04/03/2022 14:55    ASSESSMENT & PLAN Robin Nawabi89y.o. female with medical history significant for adenocarcinoma of the lung stage Ia who presents for a follow up visit.  Regarding her prior history, per Dr. MWorthy FlankNote: " CT scan of the  chest on March 05, 2021 and it showed bilateral pulmonary nodules measuring up to 1.8 x 2.3 cm in the medial right lower lobe.  There was also subpleural scarring in the peripheral left lower lobe.  A PET scan was performed on April 16, 2021 and that showed no thoracic nodal hypermetabolism.  A left upper lobe pulmonary nodule measured 0.8 cm with SUV of 2.6.  More anterior left upper lobe pulmonary nodule measured 1.0 cm with SUV of 4.6.  A lingular nodule measuring 0.6 cm with SUV of 1.6.  There was also a pleural-based right lower lobe dominant irregular pulmonary nodule measuring 1.8 x 1.6 with SUV max of 9.0.  There was no evidence for extrathoracic hypermetabolic metastasis.  The patient was referred to Dr. Valeta Harms and CT super D of the chest was performed on May 11, 2021 for preparation of the navigational bronchoscopy.  On May 15, 2021 the patient underwent video bronchoscopy with robotic assisted bronchoscopic navigation under the care of Dr. Valeta Harms.  The final cytology ([id number]) of the right lower lobe brushing and fine-needle aspiration showed malignant cells consistent with adenocarcinoma.  The fine-needle aspiration as well as the brushing of the left upper lobe lung nodules showed atypical cells present. The patient was referred to Dr. Sondra Come for consideration of SBRT. "  #Concern for worsening Pleural Effusion # Desaturation on Ambulation # Confusion -- Due to her worsening shortness of breath and concern for worsening pleural effusion we have transferred the patient to the emergency department. -- Recommend chest x-ray with consideration of thoracentesis. -- Due to confusion would recommend MRI brain to rule out metastatic disease. -- Oncology will continue to follow inpatient.  # Adenocarcinoma of the Lung, Stage Ia (T1b, N0, M0) # Recurrent in Pleural Effusion, Adenocarcinoma of the Lung Stage IV --Unfortunately cytology on her pleural effusion was consistent with  recurrence of her lung cancer with the presence of adenocarcinoma, consistent with lung cancer. Plan: --HOLD Tagrisso due to ocular, dermatological and oral toxicity. Currently on 80 mg p.o. daily given her mutation EGFR L858R -- Patient has a PD-L1 of 0%, monotherapy pembrolizumab would not be preferred at this time. --Labs today show white blood cell count  -- Return to clinic approximately 4 weeks after the start of this therapy.  #Thyroid Lesion -- Currently under evaluation by a physician in [place].  The patient reports that she would like her follow-up regarding this lesion with them.  No orders of the defined types were placed in this encounter.  All questions were answered. The patient knows to call the clinic with any problems, questions or concerns.  A total of more than 30 minutes were spent on this encounter with face-to-face time and non-face-to-face time, including preparing to see the patient, ordering tests and/or medications, counseling the patient and coordination of care as outlined above.   Ledell Peoples, MD Department of Hematology/Oncology [place] [facility] Phone: [phone] Pager: [phone] Email: [email or web address]  04/22/2022 5:22 PM

## 2022-04-23 ENCOUNTER — Inpatient Hospital Stay (HOSPITAL_COMMUNITY): Payer: Medicare Other

## 2022-04-23 DIAGNOSIS — C3431 Malignant neoplasm of lower lobe, right bronchus or lung: Secondary | ICD-10-CM | POA: Diagnosis not present

## 2022-04-23 DIAGNOSIS — J910 Malignant pleural effusion: Secondary | ICD-10-CM | POA: Diagnosis not present

## 2022-04-23 LAB — CBC
HCT: 38.6 % (ref 36.0–46.0)
Hemoglobin: 12.1 g/dL (ref 12.0–15.0)
MCH: 31.4 pg (ref 26.0–34.0)
MCHC: 31.3 g/dL (ref 30.0–36.0)
MCV: 100.3 fL — ABNORMAL HIGH (ref 80.0–100.0)
Platelets: 198 10*3/uL (ref 150–400)
RBC: 3.85 MIL/uL — ABNORMAL LOW (ref 3.87–5.11)
RDW: 15 % (ref 11.5–15.5)
WBC: 9.1 10*3/uL (ref 4.0–10.5)
nRBC: 0 % (ref 0.0–0.2)

## 2022-04-23 LAB — MAGNESIUM: Magnesium: 1.9 mg/dL (ref 1.7–2.4)

## 2022-04-23 LAB — BASIC METABOLIC PANEL
Anion gap: 8 (ref 5–15)
BUN: 19 mg/dL (ref 8–23)
CO2: 25 mmol/L (ref 22–32)
Calcium: 8 mg/dL — ABNORMAL LOW (ref 8.9–10.3)
Chloride: 104 mmol/L (ref 98–111)
Creatinine, Ser: 0.92 mg/dL (ref 0.44–1.00)
GFR, Estimated: >60 mL/min (ref >60)
Glucose, Bld: 83 mg/dL (ref 70–99)
Potassium: 4.4 mmol/L (ref 3.5–5.1)
Sodium: 137 mmol/L (ref 135–145)

## 2022-04-23 MED ORDER — LIDOCAINE HCL 1 % IJ SOLN
INTRAMUSCULAR | Status: AC
  Administered 2022-04-23: 15 mL
  Filled 2022-04-23: qty 20

## 2022-04-23 NOTE — ED Notes (Signed)
SATURATION QUALIFICATIONS: (This note is used to comply with regulatory documentation for home oxygen)  Patient Saturations on Room Air at Rest = 79%  Patient Saturations on Room Air while Ambulating = 84%  Patient Saturations on 2 Liters of oxygen while Ambulating = 91%  Please briefly explain why patient needs home oxygen:Pt is unable to maintain O2 saturation above 85% on room air at rest.

## 2022-04-23 NOTE — Discharge Summary (Signed)
Robin Bryant VPX:[id number] DOB: Sep 30, 1950 DOA: 04/22/2022  PCP: [name], NP  Admit date: 04/22/2022  Discharge date: 04/23/2022  Admitted From: Home   disposition: Home   Recommendations for Outpatient Follow-up:   Follow up with Dr. Lorenso Courier as scheduled    Home Health: N/A Equipment/Devices: N/A Consultations: Informal discussion with Dr. Lorenso Courier, patient's oncologist Discharge Condition: Improved CODE STATUS: DNR Diet Recommendation: General  Diet Order             Diet NPO time specified Except for: Ice Chips, Sips with Meds  Diet effective midnight           Diet general                    Chief Complaint  Patient presents with   Chest Pain   Shortness of Breath     Brief history of present illness from the day of admission and additional interim summary     This is a 71 year old female with past medical history of hypertension and lung cancer.  She was diagnosed with lung cancer 05/15/2021.  She has completed radiation therapy.  She is on oral chemotherapy to [place], which her hematologist is discontinuing.  She had a right-sided pleural effusion and thoracentesis once.  While at her routine oncologist visit today, she told them her lungs felt also lower from the operative fluid again.  She was walked and her oxygen dropped into the 70s while ambulating.  She was sent to the ER.                                                                   Hospital Course   Patient was admitted from the medical oncology clinic for thoracentesis of reaccumulated pleural effusion.  CT showed large left pleural effusion and increased interstitial markings.  Patient underwent 1.5 L thoracentesis by VIR without difficulty.  Patient felt much improved.  Patient is very eager to go home.  Of  note however patient was unable to be tapered off oxygen.  She dropped her O2 saturations down to mid 80s with ambulation although it was in the mid 90s at rest.  We discussed that patient may be fluid overloaded and would benefit from further inpatient workup and possible diuresis.  Patient however was adamant that she would like to go home.  Discussed with patient's oncologist Dr. Lorenso Courier who felt he could manage this as an outpatient.  Patient is discharged home without change in her medications to follow-up with Dr. Lorenso Courier in the next couple of days.    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 I have communicated with Dr Lorenso Courier that you are going home on oxygen and may need further treatment as an outpatient.   Increase activity slowly   Complete by: As directed    No wound care   Complete by: As directed        Discharge Medications   Allergies as of 04/23/2022       Reactions   Influenza Vaccines Other (See Comments)   Sick, ended up in the ICU   Other Other (See Comments)   Flu Vaccine Sick        Medication List     STOP taking these medications    osimertinib mesylate 80 MG tablet Commonly known as: Tagrisso       TAKE these medications    amLODipine 5 MG tablet Commonly known as: NORVASC Take 2 tablets (10 mg total) by mouth daily.   aspirin EC 81 MG tablet Take 81 mg by mouth every other day. Swallow whole.    Biotin 5000 MCG Tabs Take 5,000 mcg by mouth daily.   Blink Tears 0.25 % Soln Generic drug: Polyethylene Glycol 400 Place 1 drop into both eyes daily as needed (For dry eyes).   cyanocobalamin 1000 MCG tablet Commonly known as: VITAMIN B12 Take 1,000 mcg by mouth daily.   fluticasone 50 MCG/ACT nasal spray Commonly known as: FLONASE Place 1 spray into both nostrils daily as needed for allergies.   gabapentin 800 MG tablet Commonly known as: NEURONTIN Take 800 mg by mouth 4 (four) times daily.   HYDROcodone-acetaminophen 10-325 MG tablet Commonly known as: NORCO Take 1 tablet by mouth 4 (four) times daily as needed for moderate pain or severe pain.   levothyroxine 25 MCG tablet Commonly known as: SYNTHROID Take 25 mcg by mouth daily before breakfast.   magic mouthwash w/lidocaine Soln Take 5 mLs by mouth 4 (four) times daily. Viscous Lidocaine 80 ml; Diphenhydramine 80 mls; Maalox 80 mls   metoprolol succinate 100 MG 24 hr tablet Commonly known as: TOPROL-XL Take 100 mg by mouth daily. Take with or immediately following a meal.   morphine 15 MG 12 hr tablet Commonly known as: MS CONTIN Take 1 tablet (15 mg total) by mouth every 12 (twelve) hours. What changed:  when to take this reasons to take this   multivitamin with minerals Tabs tablet Take 1 tablet by mouth daily.   tretinoin 0.05 % cream Commonly known as: RETIN-A Apply 1 Application topically daily as needed (For rash).   Vitamin D-3 125 MCG (5000 UT) Tabs Take 5,000 Units by mouth daily.               Durable Medical Equipment  (From admission, onward)           Start     Ordered   04/23/22 1445  DME Oxygen  Once       Question Answer Comment  Length of Need 6 Months   Mode or (Route) Nasal cannula   Oxygen delivery system Gas  04/23/22 1445              Major procedures and Radiology Reports - PLEASE review detailed and final reports thoroughly  -      DG CHEST PORT 1  VIEW  Result Date: 04/23/2022 CLINICAL DATA:  Status post right-sided thoracentesis EXAM: PORTABLE CHEST 1 VIEW COMPARISON:  04/22/2022 FINDINGS: Midline trachea. Normal heart size. Decrease in moderate right pleural effusion. Persistent small left pleural effusion. Biapical pleuroparenchymal scarring. No pneumothorax. Diffuse interstitial thickening is improved and greater left than right today. Decreased right mid and lower lung airspace disease. 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 IMPRESSION: 1. No pneumothorax after right-sided thoracentesis. 2. Decreased right pleural effusion with improved right-sided airspace disease. 3. Similar small left pleural effusion. 4. Improved interstitial thickening since yesterday, favoring decreased interstitial edema. Electronically Signed   By: Abigail Miyamoto M.D.   On: 04/23/2022 12:08   US THORACENTESIS ASP PLEURAL SPACE W/IMG GUIDE  Result Date: 04/23/2022 INDICATION: Patient with history of lung cancer, dyspnea, recurrent right pleural effusion. Request received for therapeutic right thoracentesis. EXAM: ULTRASOUND GUIDED THERAPEUTIC RIGHT THORACENTESIS MEDICATIONS: 8 mL 1% lidocaine COMPLICATIONS: None immediate. PROCEDURE: An ultrasound guided thoracentesis was thoroughly discussed with the patient and questions answered. The benefits, risks, alternatives and complications were also discussed. The patient understands and wishes to proceed with the procedure. Written consent was obtained. Ultrasound was performed to localize and mark an adequate pocket of fluid in the right chest. The area was then prepped and draped in the normal sterile fashion. 1% Lidocaine was used for local anesthesia. Under ultrasound guidance a 6 Fr Safe-T-Centesis catheter was introduced. Thoracentesis was performed. The catheter was removed and a dressing applied. FINDINGS: A total of approximately 1.4 liters of yellow fluid was  removed. Due to patient chest discomfort and coughing only the above amount of fluid was removed today. IMPRESSION: Successful ultrasound guided therapeutic right thoracentesis yielding 1.4 liters of pleural fluid. Read by: Rowe Robert, PA-C Electronically Signed   By: Aletta Edouard M.D.   On: 04/23/2022 12:02   DG Chest Port 1 View  Result Date: 04/22/2022 CLINICAL DATA:  Shortness of breath. EXAM: PORTABLE CHEST 1 VIEW COMPARISON:  Chest radiograph dated 02/04/2022 and CT dated 04/02/2022. FINDINGS: Large right pleural effusion with compressive atelectasis of the right lung base versus pneumonia. Diffuse interstitial prominence and linear densities which may represent edema, pneumonia, or developing radiation pneumonitis or fibrosis. No pneumothorax. Stable cardiomediastinal silhouette. Atherosclerotic calcification of the aorta. No acute osseous pathology. IMPRESSION: 1. Large right pleural effusion with compressive atelectasis of the right lung base versus pneumonia. 2. Progression of pulmonary interstitial densities since the prior radiograph. Electronically Signed   By: Anner Crete M.D.   On: 04/22/2022 17:55   CT CHEST ABDOMEN PELVIS W CONTRAST  Result Date: 04/03/2022 CLINICAL DATA:  Metastatic lung cancer restaging * Tracking Code: BO * EXAM: CT CHEST, ABDOMEN, AND PELVIS WITH CONTRAST TECHNIQUE: Multidetector CT imaging of the chest, abdomen and pelvis was performed following the standard protocol during bolus administration of intravenous contrast. RADIATION DOSE REDUCTION: This exam was performed according to the departmental dose-optimization program which includes automated exposure control, adjustment of the mA and/or kV according to patient size and/or use of iterative reconstruction technique. CONTRAST:  48mL OMNIPAQUE IOHEXOL 300 MG/ML  SOLN COMPARISON:  12/24/2021 FINDINGS: CT CHEST FINDINGS Cardiovascular: Aortic atherosclerosis. Normal heart size. Left coronary artery  calcifications no pericardial effusion. Mediastinum/Nodes: No enlarged  mediastinal, hilar, or axillary lymph nodes. Unchanged hypodense nodule of the inferior pole of the left lobe of the thyroid measuring 1.9 x 1.4 cm (series 2, image 4). The right lobe of the thyroid is atrophic or surgically absent. Trachea, and esophagus demonstrate no significant findings. Lungs/Pleura: Large right pleural effusion with associated atelectasis or consolidation, slightly increased compared to prior examination. Unchanged hypodense lesion of the medial right lower lobe measuring 1.4 x 1.1 cm, predominantly obscured by effusion and atelectasis (series 2, image 40). Increase in heterogeneous airspace opacity and consolidation of the anterior lingula, which now obscures a previously seen small nodule (series 3, image 69). Biapical pleuroparenchymal scarring. Unchanged bronchiolar plugging, scarring, and nodularity of the medial segment right middle lobe and lingula (series 6, image 90). Moderate underlying centrilobular emphysema. Musculoskeletal: No chest wall abnormality. No acute osseous findings. CT ABDOMEN PELVIS FINDINGS Hepatobiliary: No solid liver abnormality is seen. No gallstones, gallbladder wall thickening, or biliary dilatation. Pancreas: Unremarkable. No pancreatic ductal dilatation or surrounding inflammatory changes. Spleen: Normal in size without significant abnormality. Adrenals/Urinary Tract: Adrenal glands are unremarkable. Unchanged subcentimeter low-attenuation lesions of the kidneys, too small to characterize although again almost certainly benign simple cysts, for which no further follow-up or characterization is required. Kidneys are otherwise normal, without renal calculi, solid lesion, or hydronephrosis. Bladder is unremarkable. Stomach/Bowel: Stomach is within normal limits. Appendix appears normal. No evidence of bowel wall thickening, distention, or inflammatory changes. Sigmoid diverticula.  Vascular/Lymphatic: Aortic atherosclerosis. No enlarged abdominal or pelvic lymph nodes. Reproductive: No mass or other abnormality. Other: No abdominal wall hernia or abnormality. No ascites. Musculoskeletal: No acute osseous findings. Levoscoliosis of the thoracolumbar spine. IMPRESSION: 1. Large right pleural effusion with associated atelectasis or consolidation, slightly increased compared to prior examination. 2. Unchanged hypodense lesion of the medial right lower lobe, predominantly obscured by effusion and atelectasis. 3. Increase in heterogeneous airspace opacity and consolidation of the anterior lingula, which now obscures a previously seen small nodule, consistent with developing radiation pneumonitis and fibrosis. 4. 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 5. No evidence of lymphadenopathy or metastatic disease in the abdomen or pelvis. 6. Unchanged hypodense nodule of the inferior pole of the left lobe of the thyroid measuring 1.9 cm. In the setting of significant comorbidities or limited life expectancy, no follow-up recommended (ref: J Am Coll Radiol. 2015 Feb;12(2): 143-50). (ref: J Am Coll Radiol. 2015 Feb;12(2): 143-50). 7. Emphysema. Aortic Atherosclerosis (ICD10-I70.0) and Emphysema (ICD10-J43.9). Electronically Signed   By: Delanna Ahmadi M.D.   On: 04/03/2022 14:55    Micro Results   Recent Results (from the past 240 hour(s))  Resp Panel by RT-PCR (Flu A&B, Covid) Anterior Nasal Swab     Status: None   Collection Time: 04/22/22  5:03 PM   Specimen: Anterior Nasal Swab  Result Value Ref Range Status   SARS Coronavirus 2 by RT PCR NEGATIVE NEGATIVE Final    Comment: (NOTE) SARS-CoV-2 target nucleic acids are NOT DETECTED.  The SARS-CoV-2 RNA is generally detectable in upper  respiratory specimens during the acute phase of infection. The lowest concentration of SARS-CoV-2 viral copies this assay can detect is 138 copies/mL. A negative result does not preclude SARS-Cov-2 infection and should not be used as the sole basis for treatment or other patient management decisions. A negative result may occur with  improper specimen collection/handling,  submission of specimen other than nasopharyngeal swab, presence of viral mutation(s) within the areas targeted by this assay, and inadequate number of viral copies(<138 copies/mL). A negative result must be combined with clinical observations, patient history, and epidemiological information. The expected result is Negative.  Fact Sheet for Patients:  EntrepreneurPulse.com.au  Fact Sheet for Healthcare Providers:  IncredibleEmployment.be  This test is no t yet approved or cleared by the [place] FDA and  has been authorized for detection and/or diagnosis of SARS-CoV-2 by FDA under an Emergency Use Authorization (EUA). This EUA will remain  in effect (meaning this test can be used) for the duration of the COVID-19 declaration under Section 564(b)(1) of the Act, 21 U.S.C.section 360bbb-3(b)(1), unless the authorization is terminated  or revoked sooner.       Influenza A by PCR NEGATIVE NEGATIVE Final   Influenza B by PCR NEGATIVE NEGATIVE Final    Comment: (NOTE) The Xpert Xpress SARS-CoV-2/FLU/RSV plus assay is intended as an aid in the diagnosis of influenza from Nasopharyngeal swab specimens and should not be used as a sole basis for treatment. Nasal washings and aspirates are unacceptable for Xpert Xpress SARS-CoV-2/FLU/RSV testing.  Fact Sheet for Patients: EntrepreneurPulse.com.au  Fact Sheet for Healthcare Providers: IncredibleEmployment.be  This test is not yet approved or cleared by the [place] FDA and has been  authorized for detection and/or diagnosis of SARS-CoV-2 by FDA under an Emergency Use Authorization (EUA). This EUA will remain in effect (meaning this test can be used) for the duration of the COVID-19 declaration under Section 564(b)(1) of the Act, 21 U.S.C. section 360bbb-3(b)(1), unless the authorization is terminated or revoked.  Performed at [facility], [place] [address], [place], [place] [address]     Today   Subjective    Robin Bryant feels much improved since admission.  Feels ready to go home.  Denies chest pain, shortness of breath or abdominal pain.  Feels they can take care of themselves with the resources they have at home.  Objective   Blood pressure (!) 172/89, pulse 69, temperature 98.2 F (36.8 C), temperature source Oral, resp. rate 16, height 5' 9.5" (1.765 m), weight 42.9 kg, SpO2 94 %.  No intake or output data in the 24 hours ending 04/23/22 1445  Exam General: Chronically ill appearing patient appears well and in good spirits sitting up in bed in no acute distress.  Eyes: sclera anicteric, conjuctiva mild injection bilaterally CVS: S1-S2, regular  Respiratory:  decreased air entry bilaterally GI: NABS, soft, NT  LE: No edema.  Neuro: A/O x 3, Moving all extremities equally with normal strength, CN 3-12 intact, grossly nonfocal.  Psych: patient is logical and coherent, judgement and insight appear normal, mood and affect appropriate to situation.    Data Review   CBC w Diff:  Lab Results  Component Value Date   WBC 9.1 04/23/2022   HGB 12.1 04/23/2022   HGB 12.7 04/22/2022   HCT 38.6 04/23/2022   PLT 198 04/23/2022   PLT 205 04/22/2022   LYMPHOPCT 7 04/22/2022   MONOPCT 11 04/22/2022   EOSPCT 1 04/22/2022   BASOPCT 1 04/22/2022    CMP:  Lab Results  Component Value Date   NA 137 04/23/2022   K 4.4 04/23/2022   CL 104 04/23/2022   CO2 25 04/23/2022   BUN 19 04/23/2022   CREATININE 0.92 04/23/2022   CREATININE  1.15 (H) 04/22/2022   PROT 7.3 04/22/2022   ALBUMIN 3.7 04/22/2022   BILITOT  0.4 04/22/2022   ALKPHOS 66 04/22/2022   AST 24 04/22/2022   ALT 11 04/22/2022  .   Total Time in preparing paper work, data evaluation and todays exam - 35 minutes  Vashti Hey M.D on 04/23/2022 at 2:45 PM  [facility]

## 2022-04-23 NOTE — ED Notes (Signed)
Pt's O2 increased to 3L O2 improved to 90%.

## 2022-04-23 NOTE — Procedures (Signed)
Ultrasound-guided  therapeutic right thoracentesis performed yielding 1.4 liters of yellow  fluid. No immediate complications. Follow-up chest x-ray pending. Due to pt coughing/chest discomfort only the above amount of fluid was removed today. EBL< 2 cc.

## 2022-04-23 NOTE — ED Notes (Signed)
SATURATION QUALIFICATIONS: (This note is used to comply with regulatory documentation for home oxygen)  Patient Saturations on Room Air at Rest = 85%  Patient Saturations on Room Air while Ambulating = 94%  Patient Saturations on 2 Liters of oxygen while Ambulating = 94%  Please briefly explain why patient needs home oxygen:Patient is needing supplemental O2 at rest.

## 2022-04-23 NOTE — Care Management Important Message (Signed)
Important Message  Patient Details  Name: Selam Pietsch MRN: [id number] Date of Birth: Jan 10, 1951   Medicare Important Message Given:  Yes     Rodney Booze, LCSW 04/23/2022, 2:40 PM

## 2022-04-23 NOTE — ED Notes (Signed)
Pt's O2 at rest was 85% on RA. Pt placed back on 2L of supplemental O2 w/ improvement in O2 sat to 94%.

## 2022-04-23 NOTE — Progress Notes (Signed)
Transition of Care [facility]) - Emergency Department Mini Assessment   Patient Details  Name: Robin Bryant MRN: [id number] Date of Birth: [birth year]  Transition of Care [facility]) CM/SW Contact:    Joaquin Courts, RN Phone Number: 04/23/2022, 3:43 PM   Clinical Narrative: Oxygen referral called to [employer] rep kristin Cross.   DME to be delivered to bedside for dc home.    ED Mini Assessment:    Barriers to Discharge: No Barriers Identified     Means of departure: Car  Interventions which prevented an admission or readmission: DME Provided    Patient Contact and Communications     Spoke with: Kirstin Cross ([employer]) Contact Date: 04/23/22,   Contact time: [phone]   Call outcome: [employer] to provide home oxygen  Patient states their goals for this hospitalization and ongoing recovery are:: to get better CMS Medicare.gov Compare Post Acute Care list provided to:: Patient Choice offered to / list presented to : Patient  Admission diagnosis:  Pleural effusion on right [J90] Patient Active Problem List   Diagnosis Date Noted   Pleural effusion, malignant 04/22/2022   Pleural effusion on right 04/22/2022   Essential hypertension 04/22/2022   Primary cancer of right lower lobe of lung ([place]) 06/06/2021   Multiple lung nodules 04/23/2021   PCP:  [name], NP Pharmacy:   [facility] [address], [place] [address] [place] [address] Phone: [phone] Fax: [place] [address] Phone: [phone] Fax: [phone]  [place], [place] [address] [place] [address] Phone: [phone] Fax: [place], [place] [address] [place] [address] Phone: [phone] Fax: [phone]

## 2022-04-23 NOTE — Care Management CC44 (Signed)
Condition Code 44 Documentation Completed  Patient Details  Name: Robin Bryant MRN: [id number] Date of Birth: 02-08-1951   Condition Code 44 given:  Yes Patient signature on Condition Code 44 notice:  Yes Documentation of 2 MD's agreement:  Yes Code 44 added to claim:  Yes    Rodney Booze, LCSW 04/23/2022, 2:41 PM

## 2022-04-23 NOTE — ED Notes (Signed)
Patient reports pain has increased and she is uncomfortable.  Rates pain 9/10.  Medicated with PRN medication

## 2022-04-23 NOTE — ED Notes (Signed)
Unable to obtain e-sign d/t equipment malfunction. D/c instructions d/w pt and s/o including findings, follow up and return precautions. Pt and s/o verbalized understanding of the above. Pt wheeled to emergency entrance.

## 2022-04-23 NOTE — Care Management Obs Status (Signed)
[place] NOTIFICATION   Patient Details  Name: Robin Bryant MRN: [id number] Date of Birth: 12/10/1950   Medicare Observation Status Notification Given:  Yes    Rodney Booze, LCSW 04/23/2022, 2:41 PM

## 2022-04-24 ENCOUNTER — Other Ambulatory Visit: Payer: Self-pay | Admitting: *Deleted

## 2022-04-24 MED ORDER — MAGIC MOUTHWASH W/LIDOCAINE: 5.0000 mL | Freq: Four times a day (QID) | ORAL | 2 refills | Status: DC

## 2022-04-29 ENCOUNTER — Telehealth: Payer: Self-pay | Admitting: *Deleted

## 2022-04-29 NOTE — Telephone Encounter (Signed)
TCT patient's friend Robin Bryant after receiving vm requesting call back. Spoke with him.  He states Robin Bryant is doing fairly well. 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  He is asking what level is best for her oxygen sats. Advised it is best to keep it >90%. She was discharged from the hospital with 02 @ 2l/min as needed for SOB and S02 <90. He voiced understanding. He said she ranged from 85-92 off her 02. Reminded him to tell her to use 02 if S02 is <90. He voiced understanding. He states her mouth is better and overall she feels better off the Tagrisso. He is asking if Dr. Lorenso Courier has plans for another  treatment.  He also asked how long she might live not taking anything  Advised that Dr. Lorenso Courier would have to speak to them about those concerns. Advised that Adhya needs to come and see Dr. Lorenso Courier in the 1-2 weeks. Early states he will ask her what day works best for her. Asked this RN is I would call him back on 05/01/22 to get that date.  Advised that I would call him back on 05/01/22  No toher questions or concerns.

## 2022-05-03 ENCOUNTER — Telehealth: Payer: Self-pay | Admitting: *Deleted

## 2022-05-03 NOTE — Telephone Encounter (Signed)
TCT patient's significant other, [name] to check on how pt is doing. Spoke with him. He states she is ok, fatigues easily. She is still doing hair styling \. He states her 02 sats have been around 90-93. Uses her 02 as needed. Advised that Dr. Lorenso Courier  needs to see her next week to discuss next steps as pt did not tolerate the Tagrisso, Advised that Dr. Lorenso Courier can see her 05/07/22 @ 2pm with labs prior to the clinic visit. He states he will talk to Ashlyn about this. Advised that I know she prefers Mondays, but Dr. Libby Maw availability is Tuesday only next week. Mr. Manus Rudd voiced understanding

## 2022-05-06 ENCOUNTER — Other Ambulatory Visit: Payer: Self-pay | Admitting: Hematology and Oncology

## 2022-05-06 DIAGNOSIS — C3490 Malignant neoplasm of unspecified part of unspecified bronchus or lung: Secondary | ICD-10-CM

## 2022-05-07 ENCOUNTER — Telehealth: Payer: Self-pay | Admitting: Hematology and Oncology

## 2022-05-07 ENCOUNTER — Inpatient Hospital Stay: Payer: Medicare Other

## 2022-05-07 ENCOUNTER — Ambulatory Visit: Payer: Medicare Other | Admitting: Hematology and Oncology

## 2022-05-07 NOTE — Telephone Encounter (Signed)
Patient's friend called to inform that patient is ill. No covid symptoms. R/s appointment to later date. Patient notified of new appointment information.

## 2022-05-11 NOTE — Progress Notes (Signed)
Rescheduled

## 2022-05-14 ENCOUNTER — Telehealth: Payer: Self-pay | Admitting: *Deleted

## 2022-05-14 ENCOUNTER — Other Ambulatory Visit: Payer: Self-pay | Admitting: *Deleted

## 2022-05-14 NOTE — Telephone Encounter (Signed)
Patient's friend Early called to report that patient feels like she needs to have fluid removed from her lungs.  States that she had to increase oxygen to 3 liters and oxygen is saturating at 89% with that many liters.  Reports when she removes to brush teeth she drops to 70%.  Reports shortness of breath with speech.    Patient is requesting order to be placed for thoracentesis as soon as possible.  She doesn't want to go to the emergency room due to the long waits.    Routing to Dr Lorenso Courier.

## 2022-05-14 NOTE — Telephone Encounter (Signed)
Per Dr Lorenso Courier patient needs to go to the Emergency room for care.  Advised significant other.  No further questions at this time.

## 2022-05-14 NOTE — Telephone Encounter (Signed)
Patient is requesting a second time for a thoracentesis before her visit with Dr Lorenso Courier week after next.  Requesting order.

## 2022-05-15 ENCOUNTER — Other Ambulatory Visit: Payer: Self-pay | Admitting: *Deleted

## 2022-05-15 ENCOUNTER — Telehealth: Payer: Self-pay | Admitting: *Deleted

## 2022-05-15 DIAGNOSIS — C3490 Malignant neoplasm of unspecified part of unspecified bronchus or lung: Secondary | ICD-10-CM

## 2022-05-15 NOTE — Telephone Encounter (Signed)
TCT patient's friend Early Manus Rudd. Spoke with him and advised that I have gotten a Thoracentisis scheduled for tomorrow, 05/16/22 . She is to be at [facility] at 1:30 pm  Provided address and phone # to Early. He voiced his appreciation. He states her 02 sats are at 90% or less even with 02 @ 2-3 l/min. Advised that she should feel better after the procedure tomorrow.  He voiced understanding.

## 2022-05-16 ENCOUNTER — Ambulatory Visit (HOSPITAL_COMMUNITY)
Admission: RE | Admit: 2022-05-16 | Discharge: 2022-05-16 | Disposition: A | Discharge status: Home / Self Care | Payer: Medicare Other | Source: Ambulatory Visit | Attending: Interventional Radiology | Admitting: Interventional Radiology

## 2022-05-16 ENCOUNTER — Ambulatory Visit (HOSPITAL_COMMUNITY)
Admission: RE | Admit: 2022-05-16 | Discharge: 2022-05-16 | Disposition: A | Discharge status: Home / Self Care | Payer: Medicare Other | Source: Ambulatory Visit | Attending: Hematology and Oncology | Admitting: Hematology and Oncology

## 2022-05-16 VITALS — BP 152/88

## 2022-05-16 DIAGNOSIS — J90 Pleural effusion, not elsewhere classified: Secondary | ICD-10-CM | POA: Insufficient documentation

## 2022-05-16 DIAGNOSIS — C3490 Malignant neoplasm of unspecified part of unspecified bronchus or lung: Secondary | ICD-10-CM | POA: Insufficient documentation

## 2022-05-16 HISTORY — PX: IR THORACENTESIS ASP PLEURAL SPACE W/IMG GUIDE: IMG5380

## 2022-05-16 MED ORDER — LIDOCAINE HCL 1 % IJ SOLN
INTRAMUSCULAR | Status: AC
  Administered 2022-05-16: 4 mL via INTRADERMAL
  Filled 2022-05-16: qty 20

## 2022-05-16 NOTE — Procedures (Signed)
PROCEDURE SUMMARY:  Successful US guided right thoracentesis. Yielded 1.0 liters of yellow, cloudy fluid. Pt tolerated procedure well. No immediate complications.  Specimen was not sent for labs. CXR ordered.  EBL < 5 mL  Docia Barrier PA-C 05/16/2022 2:09 PM

## 2022-05-21 ENCOUNTER — Telehealth: Payer: Self-pay

## 2022-05-21 NOTE — Telephone Encounter (Signed)
Patient's support person called requesting standing orders for thoracentesis.   Routed to Dr. Lorenso Courier for review.

## 2022-05-27 ENCOUNTER — Inpatient Hospital Stay (HOSPITAL_BASED_OUTPATIENT_CLINIC_OR_DEPARTMENT_OTHER): Payer: Medicare Other | Admitting: Hematology and Oncology

## 2022-05-27 ENCOUNTER — Other Ambulatory Visit: Payer: Self-pay

## 2022-05-27 ENCOUNTER — Inpatient Hospital Stay: Payer: Medicare Other | Attending: Hematology and Oncology

## 2022-05-27 ENCOUNTER — Other Ambulatory Visit: Payer: Self-pay | Admitting: *Deleted

## 2022-05-27 VITALS — BP 160/72 | HR 63 | Temp 98.3°F | Resp 15 | Wt 97.9 lb

## 2022-05-27 DIAGNOSIS — Z87891 Personal history of nicotine dependence: Secondary | ICD-10-CM | POA: Insufficient documentation

## 2022-05-27 DIAGNOSIS — E041 Nontoxic single thyroid nodule: Secondary | ICD-10-CM | POA: Insufficient documentation

## 2022-05-27 DIAGNOSIS — Z515 Encounter for palliative care: Secondary | ICD-10-CM | POA: Diagnosis not present

## 2022-05-27 DIAGNOSIS — C3490 Malignant neoplasm of unspecified part of unspecified bronchus or lung: Secondary | ICD-10-CM | POA: Diagnosis not present

## 2022-05-27 DIAGNOSIS — C3431 Malignant neoplasm of lower lobe, right bronchus or lung: Secondary | ICD-10-CM | POA: Insufficient documentation

## 2022-05-27 DIAGNOSIS — J90 Pleural effusion, not elsewhere classified: Secondary | ICD-10-CM | POA: Insufficient documentation

## 2022-05-27 LAB — CBC WITH DIFFERENTIAL (CANCER CENTER ONLY)
Abs Immature Granulocytes: 0.02 10*3/uL (ref 0.00–0.07)
Basophils Absolute: 0.1 10*3/uL (ref 0.0–0.1)
Basophils Relative: 1 %
Eosinophils Absolute: 0.1 10*3/uL (ref 0.0–0.5)
Eosinophils Relative: 1 %
HCT: 43.4 % (ref 36.0–46.0)
Hemoglobin: 13.9 g/dL (ref 12.0–15.0)
Immature Granulocytes: 0 %
Lymphocytes Relative: 7 %
Lymphs Abs: 0.8 10*3/uL (ref 0.7–4.0)
MCH: 31 pg (ref 26.0–34.0)
MCHC: 32 g/dL (ref 30.0–36.0)
MCV: 96.7 fL (ref 80.0–100.0)
Monocytes Absolute: 0.7 10*3/uL (ref 0.1–1.0)
Monocytes Relative: 7 %
Neutro Abs: 8.8 10*3/uL — ABNORMAL HIGH (ref 1.7–7.7)
Neutrophils Relative %: 84 %
Platelet Count: 384 10*3/uL (ref 150–400)
RBC: 4.49 MIL/uL (ref 3.87–5.11)
RDW: 14.5 % (ref 11.5–15.5)
WBC Count: 10.5 10*3/uL (ref 4.0–10.5)
nRBC: 0 % (ref 0.0–0.2)

## 2022-05-27 LAB — CMP (CANCER CENTER ONLY)
ALT: 13 U/L (ref 0–44)
AST: 28 U/L (ref 15–41)
Albumin: 3.6 g/dL (ref 3.5–5.0)
Alkaline Phosphatase: 93 U/L (ref 38–126)
Anion gap: 3 — ABNORMAL LOW (ref 5–15)
BUN: 32 mg/dL — ABNORMAL HIGH (ref 8–23)
CO2: 36 mmol/L — ABNORMAL HIGH (ref 22–32)
Calcium: 9.8 mg/dL (ref 8.9–10.3)
Chloride: 100 mmol/L (ref 98–111)
Creatinine: 1.38 mg/dL — ABNORMAL HIGH (ref 0.44–1.00)
GFR, Estimated: 40 mL/min — ABNORMAL LOW (ref >60)
Glucose, Bld: 114 mg/dL — ABNORMAL HIGH (ref 70–99)
Potassium: 4.9 mmol/L (ref 3.5–5.1)
Sodium: 139 mmol/L (ref 135–145)
Total Bilirubin: 0.3 mg/dL (ref 0.3–1.2)
Total Protein: 6.6 g/dL (ref 6.5–8.1)

## 2022-05-27 MED ORDER — HYDROCODONE-ACETAMINOPHEN 5-325 MG PO TABS: 2.0000 | ORAL_TABLET | Freq: Four times a day (QID) | ORAL | 0 refills | Status: DC | PRN

## 2022-05-27 NOTE — Progress Notes (Signed)
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 PDL1 0% 05/15/2021: bronchoscopy performed, biopsy consistent with adenocarcinoma of the lung.  06/18/2021: last visit with Dr. Arbutus Ped.  2/8-2/21/2023: SBRT with Dr. Roselind Messier.  10/01/2021: CT Chest w contrast shows response to therapy of right lower and left upper lobe pulmonary nodules. 10/08/2021: establish care with Dr. Leonides Schanz  12/24/2021: CT C/A/P performed. Preliminary read shows increased pleural effusion but no overt evidence of recurrence.  02/04/2022: Thoracentesis performed, fluid consistent with adenocarcinoma of the lung.  03/18/2022: start of Tagrisso 80 mg PO daily. 04/22/2022: HOLD Tagrisso due to ocular, dermatological and oral toxicity. Transferred to ER due to concern for worsening pleural effusion.   Interval History:  Robin Bryant 72 y.o. female with medical history significant for adenocarcinoma of the lung stage Ia who presents for a follow up visit. The patient's last visit was on 05/07/2022. In the interim since the last visit she has had continued shortness of breath and repeated thoracentesis  On exam today Robin Bryant reports her last thoracentesis went quite well.  She got 1 L of fluid out.  Her prior when she got 2.5 L out.  She reports that she "felt like it was not enough".  She notes that a few days later she developed shortness of breath again.  She notes that she has been in "a tough shape".  She notes that she has been taking her hydrocodone to help with her pain.  She notes that the medication does help her rest at night.  She notes that she does not wish to go back on the Tagrisso and also does not wish to proceed with chemotherapy.   She continues on 2 L of oxygen but over the Christmas holiday was on 3 L.  She continues to have +2 pitting in her lower extremities.  Today we discussed comfort based care and hospice.  She was interested in proceeding with a more comfort based approach moving forward.  She otherwise denies any fevers, chills, sweats, nausea, vomiting or diarrhea.  Full 10 point ROS was otherwise negative.   MEDICAL HISTORY:  Past Medical History:  Diagnosis Date   Back pain    Cancer [facility])    History of radiation therapy    Left Lung, Right Lung 06/27/21-07/10/21- Dr. Antony Blackbird   HTN (hypertension)    Scoliosis    Sinus congestion     SURGICAL HISTORY: Past Surgical History:  Procedure Laterality Date   ABDOMINAL HYSTERECTOMY     BACK SURGERY     BRONCHIAL BIOPSY  05/15/2021   Procedure: BRONCHIAL BIOPSIES;  Surgeon: Josephine Igo, DO;  Location: [facility];  Service: Pulmonary;;   BRONCHIAL BRUSHINGS  05/15/2021   Procedure: BRONCHIAL BRUSHINGS;  Surgeon: Josephine Igo, DO;  Location: [facility];  Service: Pulmonary;;   BRONCHIAL NEEDLE ASPIRATION BIOPSY  05/15/2021   Procedure: BRONCHIAL NEEDLE ASPIRATION BIOPSIES;  Surgeon: Josephine Igo, DO;  Location: [facility];  Service: Pulmonary;;   BRONCHIAL WASHINGS  05/15/2021   Procedure: BRONCHIAL WASHINGS;  Surgeon: Josephine Igo, DO;  Location: [facility];  Service: Pulmonary;;   IR THORACENTESIS ASP PLEURAL SPACE W/IMG GUIDE  05/16/2022   IR THORACENTESIS ASP PLEURAL SPACE W/IMG GUIDE  05/31/2022   VIDEO BRONCHOSCOPY  WITH RADIAL ENDOBRONCHIAL ULTRASOUND  05/15/2021   Procedure: RADIAL ENDOBRONCHIAL ULTRASOUND;  Surgeon: Josephine Igo, DO;  Location: [facility];  Service: Pulmonary;;    SOCIAL HISTORY: Social History   Socioeconomic History   Marital status: Divorced    Spouse name: Not on file   Number of children: Not on file   Years of education: Not on file   Highest education level: Not on file  Occupational  History   Not on file  Tobacco Use   Smoking status: Former    Packs/day: 0.50    Years: 50.00    Total pack years: 25.00    Types: Cigarettes    Quit date: 04/04/2021    Years since quitting: 1.1   Smokeless tobacco: Never  Vaping Use   Vaping Use: Never used  Substance and Sexual Activity   Alcohol use: Never   Drug use: Never   Sexual activity: Not on file  Other Topics Concern   Not on file  Social History Narrative   Not on file   Social Determinants of Health   Financial Resource Strain: Not on file  Food Insecurity: Not on file  Transportation Needs: Not on file  Physical Activity: Not on file  Stress: Not on file  Social Connections: Not on file  Intimate Partner Violence: Not on file    FAMILY HISTORY: Family History  Problem Relation Age of Onset   Alzheimer's disease Mother    Cancer Father    Lung cancer Father     ALLERGIES:  is allergic to influenza vaccines and other.  MEDICATIONS:  Current Outpatient Medications  Medication Sig Dispense Refill   aspirin EC 81 MG tablet Take 81 mg by mouth every other day. Swallow whole.     Biotin 5000 MCG TABS Take 5,000 mcg by mouth daily.     Cholecalciferol (VITAMIN D-3) 125 MCG (5000 UT) TABS Take 5,000 Units by mouth daily.     fluticasone (FLONASE) 50 MCG/ACT nasal spray Place 1 spray into both nostrils daily as needed for allergies.     gabapentin (NEURONTIN) 800 MG tablet Take 800 mg by mouth 4 (four) times daily.     levothyroxine (SYNTHROID) 25 MCG tablet Take 25 mcg by mouth daily before breakfast.     magic mouthwash w/lidocaine SOLN Take 5 mLs by mouth 4 (four) times daily. Viscous Lidocaine 80 ml; Diphenhydramine 80 mls; Maalox 80 mls 240 mL 2   metoprolol succinate (TOPROL-XL) 100 MG 24 hr tablet Take 100 mg by mouth daily. Take with or immediately following a meal.     morphine (MS CONTIN) 15 MG 12 hr tablet Take 1 tablet (15 mg total) by mouth every 12 (twelve) hours. (Patient taking differently:  Take 15 mg by mouth daily as needed for pain.) 60 tablet 0   Multiple Vitamin (MULTIVITAMIN WITH MINERALS) TABS tablet Take 1 tablet by mouth daily.     ondansetron (ZOFRAN) 8 MG tablet Take 1 tablet (8 mg total) by mouth every 8 (eight) hours as needed for nausea or vomiting. 30 tablet 3   oxyCODONE (OXY IR/ROXICODONE) 5 MG immediate release tablet Take 1 tablet (5 mg total) by mouth every 4 (four) hours as needed for severe pain. 90 tablet 0   Polyethylene Glycol 400 (BLINK TEARS) 0.25 % SOLN Place 1 drop into both eyes daily as needed (For dry eyes).     tretinoin (RETIN-A) 0.05 % cream Apply 1 Application topically daily as needed (For rash).  vitamin B-12 (CYANOCOBALAMIN) 1000 MCG tablet Take 1,000 mcg by mouth daily.     No current facility-administered medications for this visit.    REVIEW OF SYSTEMS:   Constitutional: ( - ) fevers, ( - )  chills , ( - ) night sweats Eyes: ( - ) blurriness of vision, ( - ) double vision, ( - ) watery eyes Ears, nose, mouth, throat, and face: ( - ) mucositis, ( - ) sore throat Respiratory: ( - ) cough, ( - ) dyspnea, ( - ) wheezes Cardiovascular: ( - ) palpitation, ( - ) chest discomfort, ( - ) lower extremity swelling Gastrointestinal:  ( - ) nausea, ( - ) heartburn, ( - ) change in bowel habits Skin: ( - ) abnormal skin rashes Lymphatics: ( - ) new lymphadenopathy, ( - ) easy bruising Neurological: ( - ) numbness, ( - ) tingling, ( - ) new weaknesses Behavioral/Psych: ( - ) mood change, ( - ) new changes  All other systems were reviewed with the patient and are negative.  PHYSICAL EXAMINATION: ECOG PERFORMANCE STATUS: 1 - Symptomatic but completely ambulatory  Vitals:   05/27/22 1420  BP: (!) 160/72  Pulse: 63  Resp: 15  Temp: 98.3 F (36.8 C)  SpO2: 100%    Filed Weights   05/27/22 1420  Weight: 97 lb 14.4 oz (44.4 kg)     GENERAL: Well-appearing middle-age Caucasian female.  Quite thin.  Alert, no distress and  comfortable MOUTH: normal mucosa.  SKIN: skin color, texture, turgor are normal, no rashes or significant lesions EYES:.conjunctiva are pink and non-injected, sclera clear LUNGS: clear to auscultation and percussion with normal breathing effort HEART: regular rate & rhythm and no murmurs and +2 bilateral lower extremity edema Musculoskeletal: no cyanosis of digits and no clubbing  PSYCH: alert & oriented x 3, fluent speech NEURO: no focal motor/sensory deficits  LABORATORY DATA:  I have reviewed the data as listed    Latest Ref Rng & Units 05/27/2022    1:19 PM 04/23/2022    6:10 AM 04/22/2022    9:00 PM  CBC  WBC 4.0 - 10.5 K/uL 10.5  9.1  8.4   Hemoglobin 12.0 - 15.0 g/dL 25.2  01.3  39.7   Hematocrit 36.0 - 46.0 % 43.4  38.6  40.2   Platelets 150 - 400 K/uL 384  198  206        Latest Ref Rng & Units 05/27/2022    1:19 PM 04/23/2022    6:10 AM 04/22/2022    9:00 PM  CMP  Glucose 70 - 99 mg/dL 885  83    BUN 8 - 23 mg/dL 32  19    Creatinine 0.93 - 1.00 mg/dL 6.64  8.22  4.01   Sodium 135 - 145 mmol/L 139  137    Potassium 3.5 - 5.1 mmol/L 4.9  4.4    Chloride 98 - 111 mmol/L 100  104    CO2 22 - 32 mmol/L 36  25    Calcium 8.9 - 10.3 mg/dL 9.8  8.0    Total Protein 6.5 - 8.1 g/dL 6.6     Total Bilirubin 0.3 - 1.2 mg/dL 0.3     Alkaline Phos 38 - 126 U/L 93     AST 15 - 41 U/L 28     ALT 0 - 44 U/L 13       RADIOGRAPHIC STUDIES: I have personally reviewed the radiological images as listed and agreed with the  findings in the report: response to radiation therapy with improved nodules.   IR THORACENTESIS ASP PLEURAL SPACE W/IMG GUIDE  Result Date: 05/31/2022 INDICATION: History of lung cancer and recurrent right pleural effusion. Request for therapeutic right thoracentesis. EXAM: ULTRASOUND GUIDED RIGHT THORACENTESIS MEDICATIONS: 1% plain lidocaine, 5 mL COMPLICATIONS: None immediate. PROCEDURE: An ultrasound guided thoracentesis was thoroughly discussed with the patient and  questions answered. The benefits, risks, alternatives and complications were also discussed. The patient understands and wishes to proceed with the procedure. Written consent was obtained. Ultrasound was performed to localize and mark an adequate pocket of fluid in the right chest. The area was then prepped and draped in the normal sterile fashion. 1% Lidocaine was used for local anesthesia. Under ultrasound guidance a 6 Fr Safe-T-Centesis catheter was introduced. Thoracentesis was performed. The catheter was removed and a dressing applied. FINDINGS: A total of approximately 1.6 L of clear yellow fluid was removed. IMPRESSION: Successful ultrasound guided right thoracentesis yielding 1.6 of pleural fluid. Read by: Brayton El PA-C Electronically Signed   By: Judie Petit.  Shick M.D.   On: 05/31/2022 11:58   DG Chest 1 View  Result Date: 05/31/2022 CLINICAL DATA:  S/P thoracentesis  1600cc right side EXAM: CHEST  1 VIEW COMPARISON:  05/16/2022 FINDINGS: No pneumothorax. Persistent moderate right pleural effusion. Small left pleural effusion. Marginally improved atelectasis/consolidation of the right lung base. Diffuse interstitial opacities throughout both lungs relatively stable. Biapical pleuroparenchymal scarring with coarse calcifications on the right. Heart size and mediastinal contours are within normal limits. Aortic Atherosclerosis (ICD10-170.0). Visualized bones unremarkable. IMPRESSION: 1. No pneumothorax post right thoracentesis. 2. Persistent moderate right and small left pleural effusions. Electronically Signed   By: Corlis Leak M.D.   On: 05/31/2022 09:46   DG Chest 1 View  Result Date: 05/16/2022 CLINICAL DATA:  Status post thoracentesis.  1 L removed. EXAM: CHEST  1 VIEW COMPARISON:  April 23, 2022 FINDINGS: Biapical pleuroparenchymal thickening is stable. No pneumothorax. Interstitial markings in the lungs remain increased but improved since April 23, 2022. Bilateral pleural effusions, right  greater than left, persist. No change in the cardiomediastinal silhouette. Otherwise, the exam is stable. IMPRESSION: 1. No pneumothorax after thoracentesis. 2. Persistent bilateral pleural effusions, right greater than left. 3. Persistent but improved pulmonary edema. 4. No other changes. Electronically Signed   By: Gerome Sam III M.D.   On: 05/16/2022 18:33   IR THORACENTESIS ASP PLEURAL SPACE W/IMG GUIDE  Result Date: 05/16/2022 INDICATION: Patient with history of lung cancer, recurrent right pleural effusion. Request is made for therapeutic right thoracentesis. EXAM: ULTRASOUND GUIDED RIGHT THORACENTESIS MEDICATIONS: 4 mL 1% lidocaine COMPLICATIONS: None immediate. PROCEDURE: An ultrasound guided thoracentesis was thoroughly discussed with the patient and questions answered. The benefits, risks, alternatives and complications were also discussed. The patient understands and wishes to proceed with the procedure. Written consent was obtained. Ultrasound was performed to localize and mark an adequate pocket of fluid in the right chest. The area was then prepped and draped in the normal sterile fashion. 1% Lidocaine was used for local anesthesia. Under ultrasound guidance a 6 Fr Safe-T-Centesis catheter was introduced. Thoracentesis was performed. The catheter was removed and a dressing applied. FINDINGS: A total of approximately 1.0 liters of yellow fluid was removed. IMPRESSION: Successful ultrasound guided right thoracentesis yielding 1.0 liters of pleural fluid. Read by: Loyce Dys PA-C Electronically Signed   By: Malachy Moan M.D.   On: 05/16/2022 14:49    ASSESSMENT & PLAN Robin Bryant 72 y.o.  female with medical history significant for adenocarcinoma of the lung stage Ia who presents for a follow up visit.  Regarding her prior history, per Dr. Asa Lente Note: " CT scan of the chest on March 05, 2021 and it showed bilateral pulmonary nodules measuring up to 1.8 x 2.3 cm in the  medial right lower lobe.  There was also subpleural scarring in the peripheral left lower lobe.  A PET scan was performed on April 16, 2021 and that showed no thoracic nodal hypermetabolism.  A left upper lobe pulmonary nodule measured 0.8 cm with SUV of 2.6.  More anterior left upper lobe pulmonary nodule measured 1.0 cm with SUV of 4.6.  A lingular nodule measuring 0.6 cm with SUV of 1.6.  There was also a pleural-based right lower lobe dominant irregular pulmonary nodule measuring 1.8 x 1.6 with SUV max of 9.0.  There was no evidence for extrathoracic hypermetabolic metastasis.  The patient was referred to Dr. Tonia Brooms and CT super D of the chest was performed on May 11, 2021 for preparation of the navigational bronchoscopy.  On May 15, 2021 the patient underwent video bronchoscopy with robotic assisted bronchoscopic navigation under the care of Dr. Tonia Brooms.  The final cytology ([id number]) of the right lower lobe brushing and fine-needle aspiration showed malignant cells consistent with adenocarcinoma.  The fine-needle aspiration as well as the brushing of the left upper lobe lung nodules showed atypical cells present. The patient was referred to Dr. Roselind Messier for consideration of SBRT. "  # Adenocarcinoma of the Lung, Stage Ia (T1b, N0, M0) # Recurrent in Pleural Effusion, Adenocarcinoma of the Lung Stage IV --Unfortunately cytology on her pleural effusion was consistent with recurrence of her lung cancer with the presence of adenocarcinoma, consistent with lung cancer. Plan: --HOLD Tagrisso due to ocular, dermatological and oral toxicity.  -- Patient has a PD-L1 of 0%, monotherapy pembrolizumab would not be preferred at this time. --patient undergoing frequent thoracentesis. Last on 05/16/2022.  May need to consider Pleurx catheter placement but we would need pulmonology assistance in management of the Pleurx. --Labs today show white blood cell count 10.5, Hgb 13.9, MCV 96.7, Plt  384 --Unable to tolerate Tagrisso and does not wish to proceed with chemotherapy.  Will begin a more comfort based approach moving forward.  Will connect her with Lowella Bandy and palliative care -- Return to clinic approximately 4 weeks   #Thyroid Lesion -- Currently under evaluation by a physician in [place].  The patient reports that she would like her follow-up regarding this lesion with them.  No orders of the defined types were placed in this encounter.  All questions were answered. The patient knows to call the clinic with any problems, questions or concerns.  A total of more than 30 minutes were spent on this encounter with face-to-face time and non-face-to-face time, including preparing to see the patient, ordering tests and/or medications, counseling the patient and coordination of care as outlined above.   Ulysees Barns, MD Department of Hematology/Oncology [facility] Phone: [phone] Pager: [phone] Email: [email or web address]  06/09/2022 5:57 PM

## 2022-05-29 ENCOUNTER — Telehealth: Payer: Self-pay

## 2022-05-29 NOTE — Telephone Encounter (Signed)
Patient's friend left message to ask when patient would be scheduled with palliative care. Spoke with Maygan RN and Lexine Baton NP. They will reach out to patient.

## 2022-05-30 ENCOUNTER — Telehealth: Payer: Self-pay

## 2022-05-30 ENCOUNTER — Encounter: Payer: Self-pay | Admitting: Nurse Practitioner

## 2022-05-30 ENCOUNTER — Inpatient Hospital Stay (HOSPITAL_BASED_OUTPATIENT_CLINIC_OR_DEPARTMENT_OTHER): Payer: Medicare Other | Admitting: Nurse Practitioner

## 2022-05-30 ENCOUNTER — Other Ambulatory Visit: Payer: Self-pay

## 2022-05-30 DIAGNOSIS — R110 Nausea: Secondary | ICD-10-CM

## 2022-05-30 DIAGNOSIS — R630 Anorexia: Secondary | ICD-10-CM | POA: Diagnosis not present

## 2022-05-30 DIAGNOSIS — C3431 Malignant neoplasm of lower lobe, right bronchus or lung: Secondary | ICD-10-CM

## 2022-05-30 DIAGNOSIS — R530 Neoplastic (malignant) related fatigue: Secondary | ICD-10-CM

## 2022-05-30 DIAGNOSIS — Z515 Encounter for palliative care: Secondary | ICD-10-CM

## 2022-05-30 DIAGNOSIS — G893 Neoplasm related pain (acute) (chronic): Secondary | ICD-10-CM

## 2022-05-30 DIAGNOSIS — C3490 Malignant neoplasm of unspecified part of unspecified bronchus or lung: Secondary | ICD-10-CM

## 2022-05-30 MED ORDER — ONDANSETRON HCL 8 MG PO TABS: 8.0000 mg | ORAL_TABLET | Freq: Three times a day (TID) | ORAL | 3 refills | Status: DC | PRN

## 2022-05-30 NOTE — Telephone Encounter (Signed)
Patient and SO called requesting to have fluid drained from lung. VO received from Dr. Lorenso Courier. Appointment scheduled for 1/12/@ 9 AM. Pt aware to arrive 30 minutes prior.  Confirmed telephone call with Lexine Baton NP - Palliative , today @ 2:30 PM.

## 2022-05-30 NOTE — Progress Notes (Signed)
Palliative Medicine [facility]  [phone] Fax:(336) [phone]   Name: Robin Bryant Date: 05/30/2022 MRN: [id number]  DOB: 1950/09/28  Patient Care Team: Tamsen Roers, NP as PCP - General    I connected with Robin Bryant on 05/30/22 at  2:30 PM EST by phone and verified that I am speaking with the correct person using two identifiers.   I discussed the limitations, risks, security and privacy concerns of performing an evaluation and management service by telemedicine and the availability of in-person appointments. I also discussed with the patient that there may be a patient responsible charge related to this service. The patient expressed understanding and agreed to proceed.   Other persons participating in the visit and their role in the encounter: Robin Bryant (Significant Other)    Patient's location: home  Provider's location: [facility] office   Chief Complaint: New referral for symptom management and pain management   REASON FOR CONSULTATION: Robin Bryant is a 72 y.o. female with oncologic medical history including hypertension and lung cancer (04/2021).  Palliative ask to see for symptom management, pain management, and goals of care.    SOCIAL HISTORY:     reports that she quit smoking about 13 months ago. Her smoking use included cigarettes. She has a 25.00 pack-year smoking history. She has never used smokeless tobacco. She reports that she does not drink alcohol and does not use drugs.  ADVANCE DIRECTIVES:  None on file  CODE STATUS:   PAST MEDICAL HISTORY: Past Medical History:  Diagnosis Date   Back pain    Cancer (HCC)    History of radiation therapy    Left Lung, Right Lung 06/27/21-07/10/21- Dr. Antony Blackbird   HTN (hypertension)    Scoliosis    Sinus congestion     PAST SURGICAL HISTORY:  Past Surgical History:  Procedure Laterality Date   ABDOMINAL HYSTERECTOMY     BACK SURGERY     BRONCHIAL BIOPSY   05/15/2021   Procedure: BRONCHIAL BIOPSIES;  Surgeon: Josephine Igo, DO;  Location: [facility];  Service: Pulmonary;;   BRONCHIAL BRUSHINGS  05/15/2021   Procedure: BRONCHIAL BRUSHINGS;  Surgeon: Josephine Igo, DO;  Location: [facility];  Service: Pulmonary;;   BRONCHIAL NEEDLE ASPIRATION BIOPSY  05/15/2021   Procedure: BRONCHIAL NEEDLE ASPIRATION BIOPSIES;  Surgeon: Josephine Igo, DO;  Location: [facility];  Service: Pulmonary;;   BRONCHIAL WASHINGS  05/15/2021   Procedure: BRONCHIAL WASHINGS;  Surgeon: Josephine Igo, DO;  Location: [facility];  Service: Pulmonary;;   IR THORACENTESIS ASP PLEURAL SPACE W/IMG GUIDE  05/16/2022   VIDEO BRONCHOSCOPY WITH RADIAL ENDOBRONCHIAL ULTRASOUND  05/15/2021   Procedure: RADIAL ENDOBRONCHIAL ULTRASOUND;  Surgeon: Josephine Igo, DO;  Location: [facility]OS[facility];  Service: Pulmonary;;    HEMATOLOGY/ONCOLOGY HISTORY:  Oncology History  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  MEDICATIONS:  Current Outpatient Medications  Medication Sig Dispense Refill   aspirin EC 81 MG tablet Take 81 mg by mouth every other day. Swallow whole.     Biotin 5000 MCG TABS Take 5,000 mcg by mouth daily.     Cholecalciferol (VITAMIN D-3) 125 MCG (  5000 UT) TABS Take 5,000 Units by mouth daily.     fluticasone (FLONASE) 50 MCG/ACT nasal spray Place 1 spray into both nostrils daily as needed for allergies.     gabapentin (NEURONTIN) 800 MG tablet Take 800 mg by mouth 4 (four) times daily.     HYDROcodone-acetaminophen (NORCO/VICODIN) 5-325 MG tablet Take 2 tablets by mouth every 6 (six) hours as needed for moderate pain. 60 tablet 0   levothyroxine  (SYNTHROID) 25 MCG tablet Take 25 mcg by mouth daily before breakfast.     magic mouthwash w/lidocaine SOLN Take 5 mLs by mouth 4 (four) times daily. Viscous Lidocaine 80 ml; Diphenhydramine 80 mls; Maalox 80 mls 240 mL 2   metoprolol succinate (TOPROL-XL) 100 MG 24 hr tablet Take 100 mg by mouth daily. Take with or immediately following a meal.     morphine (MS CONTIN) 15 MG 12 hr tablet Take 1 tablet (15 mg total) by mouth every 12 (twelve) hours. (Patient taking differently: Take 15 mg by mouth daily as needed for pain.) 60 tablet 0   Multiple Vitamin (MULTIVITAMIN WITH MINERALS) TABS tablet Take 1 tablet by mouth daily.     Polyethylene Glycol 400 (BLINK TEARS) 0.25 % SOLN Place 1 drop into both eyes daily as needed (For dry eyes).     tretinoin (RETIN-A) 0.05 % cream Apply 1 Application topically daily as needed (For rash).     vitamin B-12 (CYANOCOBALAMIN) 1000 MCG tablet Take 1,000 mcg by mouth daily.     No current facility-administered medications for this visit.    VITAL SIGNS: There were no vitals taken for this visit. There were no vitals filed for this visit.  Estimated body mass index is 14.25 kg/m as calculated from the following:   Height as of 04/22/22: 5' 9.5" (1.765 m).   Weight as of 05/27/22: 97 lb 14.4 oz (44.4 kg).  LABS: CBC:    Component Value Date/Time   WBC 10.5 05/27/2022 1319   WBC 9.1 04/23/2022 0610   HGB 13.9 05/27/2022 1319   HCT 43.4 05/27/2022 1319   PLT 384 05/27/2022 1319   MCV 96.7 05/27/2022 1319   NEUTROABS 8.8 (H) 05/27/2022 1319   LYMPHSABS 0.8 05/27/2022 1319   MONOABS 0.7 05/27/2022 1319   EOSABS 0.1 05/27/2022 1319   BASOSABS 0.1 05/27/2022 1319   Comprehensive Metabolic Panel:    Component Value Date/Time   NA 139 05/27/2022 1319   K 4.9 05/27/2022 1319   CL 100 05/27/2022 1319   CO2 36 (H) 05/27/2022 1319   BUN 32 (H) 05/27/2022 1319   CREATININE 1.38 (H) 05/27/2022 1319   GLUCOSE 114 (H) 05/27/2022 1319   CALCIUM 9.8  05/27/2022 1319   AST 28 05/27/2022 1319   ALT 13 05/27/2022 1319   ALKPHOS 93 05/27/2022 1319   BILITOT 0.3 05/27/2022 1319   PROT 6.6 05/27/2022 1319   ALBUMIN 3.6 05/27/2022 1319    RADIOGRAPHIC STUDIES:   PERFORMANCE STATUS (ECOG) : 1 - Symptomatic but completely ambulatory  Review of Systems  Constitutional:  Positive for activity change, appetite change and fatigue.  Respiratory:  Positive for shortness of breath.   Musculoskeletal:  Positive for arthralgias and back pain.  Unless otherwise noted, a complete review of systems is negative.   IMPRESSION: This is my initial visit with Robin Bryant today. I connected via phone. Her significant other, Robin. Patient is alert and able to engage in discussions appropriately. Patient endorsing shortness of breath and ongoing pain.  I introduced myself, [place] RN, and Palliative's role in collaboration with the oncology team. Concept of Palliative Care was introduced as specialized medical care for people and their families living with serious illness.  It focuses on providing relief from the symptoms and stress of a serious illness.  The goal is to improve quality of life for both the patient and the family. Values and goals of care important to patient and family were attempted to be elicited.   Robin Bryant lives in the home with her significant other, Robin of more than 22 years. She has one daughter. 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   At home patient is able to perform most ADLs independently with some assistance due to pain, fatigue, and shortness of breath. Reports appetite is fair. Some days are better than others.   We discussed her ongoing shortness of breath. Patient is emotional expressing concerns of fluid rapidly re accumulating over a period of 1-2 weeks. This is causing her increased fatigue and shortness of breath. Unfortunately she has not been able to work as much due to her symptoms. She is scheduled for a  thoracentesis on Friday 1/12. She is requesting to see if procedure could be done later in the day as she lives in [place] and appointment is at [place]. I have reached out to IR for later time. Patient had previous thoracentesis on 12/28 yielding 1 liter. If patient continues to require therapeutic thoracentesis would may need to consider Pleurx however would warrant further evaluation and evaluation of need.   We spent time discussing patients overall pain. She shares her pain is in her chest, back, and at times lower extremities. Robin Bryant reports she has been on gabapentin since 2017 as well as Norco 10mg  every 6 hrs due to chronic pain. Unfortunately shee feels over the past 3-4 weeks her pain has increased and is no longer as controlled. Pain intensifies when she begins to build up fluid. She reports worst pain as 9-10/10. Pain will decrease to 7-8/10 after medication where it at one point would decrease to 3-4/10 and managing. Allowing her to function as she desired.   We discussed current regimen at length. Encouraged patient to continue with her gabapentin. We will discontinue use of Norco and switch her to Oxycodone every 4-6 hours as needed for breakthrough pain. She MS Contin on hand. Unfortunately she has not been taking as directed and more on an as needed basis. Education provided on the use of long-term medication and it's effectiveness. She verbalized understanding and awareness this is to be taken daily.   We will continue to closely monitor and adjust as needed.   Patient denies concerns for constipation. Is taking daily stool softener and confirms that she has daily bowel movement. Education provided on continuing regimen in the setting of opioid use.   I discussed the importance of continued conversation with family and their medical providers regarding overall plan of care and treatment options, ensuring decisions are within the context of the patients values and GOCs.  PLAN: Established  therapeutic relationship. Education provided on palliative's role in collaboration with their Oncology/Radiation team. MS Contin 15 mg every 12 hours.  Patient has not been taking as prescribed.  Education provided. Gabapentin 800 mg 4 times daily as previously prescribed. Zofran as needed for nausea We will consider transitioning from hydrocodone 10 mg every 6 hours to oxycodone 5 mg every 6 hours as needed for breakthrough pain.  Given patient's MME with 25% cross  tolerance this will be a safe transition. Patient scheduled for thoracentesis on 1/12.  She is aware of appointment as previously scheduled. I will plan to see patient back in 1-2 weeks in collaboration to other oncology appointments.    Patient expressed understanding and was in agreement with this plan. She also understands that She can call the clinic at any time with any questions, concerns, or complaints.   Thank you for your referral and allowing Palliative to assist in Robin Bryant care.   Number and complexity of problems addressed: HIGH - 1 or more chronic illnesses with SEVERE exacerbation, progression, or side effects of treatment - advanced cancer, pain. Any controlled substances utilized were prescribed in the context of palliative care.  Time Total: 55 min   Visit consisted of counseling and education dealing with the complex and emotionally intense issues of symptom management and palliative care in the setting of serious and potentially life-threatening illness.Greater than 50%  of this time was spent counseling and coordinating care related to the above assessment and plan.  Signed by: Willette Alma, AGPCNP-BC Palliative Medicine Team/[facility]

## 2022-05-31 ENCOUNTER — Other Ambulatory Visit (HOSPITAL_COMMUNITY): Payer: Self-pay | Admitting: Radiology

## 2022-05-31 ENCOUNTER — Ambulatory Visit (HOSPITAL_COMMUNITY)
Admission: RE | Admit: 2022-05-31 | Discharge: 2022-05-31 | Disposition: A | Discharge status: Home / Self Care | Payer: Medicare Other | Source: Ambulatory Visit | Attending: Hematology and Oncology | Admitting: Hematology and Oncology

## 2022-05-31 ENCOUNTER — Ambulatory Visit (HOSPITAL_COMMUNITY)
Admission: RE | Admit: 2022-05-31 | Discharge: 2022-05-31 | Disposition: A | Discharge status: Home / Self Care | Payer: Medicare Other | Source: Ambulatory Visit | Attending: Radiology | Admitting: Radiology

## 2022-05-31 DIAGNOSIS — C3490 Malignant neoplasm of unspecified part of unspecified bronchus or lung: Secondary | ICD-10-CM | POA: Diagnosis not present

## 2022-05-31 DIAGNOSIS — J90 Pleural effusion, not elsewhere classified: Secondary | ICD-10-CM

## 2022-05-31 HISTORY — PX: IR THORACENTESIS ASP PLEURAL SPACE W/IMG GUIDE: IMG5380

## 2022-05-31 MED ORDER — LIDOCAINE HCL 1 % IJ SOLN
INTRAMUSCULAR | Status: AC
  Filled 2022-05-31: qty 20

## 2022-06-03 ENCOUNTER — Telehealth: Payer: Self-pay

## 2022-06-03 NOTE — Telephone Encounter (Signed)
Patient called requesting a PleurX catheter. She states that it is hard for her coming in very week to have fluid drained from her lung.  Routed to provider.

## 2022-06-04 ENCOUNTER — Other Ambulatory Visit: Payer: Self-pay

## 2022-06-04 MED ORDER — OXYCODONE HCL 5 MG PO TABS: 5.0000 mg | ORAL_TABLET | ORAL | 0 refills | Status: DC | PRN

## 2022-06-04 NOTE — Telephone Encounter (Signed)
Pt called for a refill on pain medications, per Lowella Bandy, NP pt to try oxycodone for her pain, pt aware. Med sent to pharmacy, pharmacy stock confirmed by RN. No further questions or concerns at this time.

## 2022-06-06 ENCOUNTER — Other Ambulatory Visit: Payer: Self-pay

## 2022-06-06 DIAGNOSIS — J90 Pleural effusion, not elsewhere classified: Secondary | ICD-10-CM

## 2022-06-06 DIAGNOSIS — C3431 Malignant neoplasm of lower lobe, right bronchus or lung: Secondary | ICD-10-CM

## 2022-06-06 NOTE — Progress Notes (Signed)
Patient called requesting to have her "lung drained." Verbal order received  for thoracentesis from Dr. Leonides Schanz

## 2022-06-06 NOTE — Progress Notes (Signed)
Patient called requesting pleurx drain. Patient lives in [place], [place] and is requiring repeated trips to manage pleural effusion.

## 2022-06-10 ENCOUNTER — Ambulatory Visit (HOSPITAL_COMMUNITY)
Admission: RE | Admit: 2022-06-10 | Discharge: 2022-06-10 | Disposition: A | Discharge status: Home / Self Care | Payer: Medicare Other | Source: Ambulatory Visit | Attending: Interventional Radiology | Admitting: Interventional Radiology

## 2022-06-10 ENCOUNTER — Encounter: Payer: Self-pay | Admitting: Nurse Practitioner

## 2022-06-10 ENCOUNTER — Ambulatory Visit (HOSPITAL_COMMUNITY)
Admission: RE | Admit: 2022-06-10 | Discharge: 2022-06-10 | Disposition: A | Discharge status: Home / Self Care | Payer: Medicare Other | Source: Ambulatory Visit | Attending: Hematology and Oncology | Admitting: Hematology and Oncology

## 2022-06-10 ENCOUNTER — Inpatient Hospital Stay (HOSPITAL_BASED_OUTPATIENT_CLINIC_OR_DEPARTMENT_OTHER): Payer: Medicare Other | Admitting: Nurse Practitioner

## 2022-06-10 VITALS — BP 155/144

## 2022-06-10 DIAGNOSIS — R530 Neoplastic (malignant) related fatigue: Secondary | ICD-10-CM | POA: Diagnosis not present

## 2022-06-10 DIAGNOSIS — Z515 Encounter for palliative care: Secondary | ICD-10-CM | POA: Diagnosis not present

## 2022-06-10 DIAGNOSIS — J90 Pleural effusion, not elsewhere classified: Secondary | ICD-10-CM | POA: Insufficient documentation

## 2022-06-10 DIAGNOSIS — C3431 Malignant neoplasm of lower lobe, right bronchus or lung: Secondary | ICD-10-CM

## 2022-06-10 DIAGNOSIS — G893 Neoplasm related pain (acute) (chronic): Secondary | ICD-10-CM | POA: Diagnosis not present

## 2022-06-10 DIAGNOSIS — R630 Anorexia: Secondary | ICD-10-CM

## 2022-06-10 DIAGNOSIS — Z7189 Other specified counseling: Secondary | ICD-10-CM

## 2022-06-10 HISTORY — PX: IR THORACENTESIS ASP PLEURAL SPACE W/IMG GUIDE: IMG5380

## 2022-06-10 MED ORDER — CELECOXIB 100 MG PO CAPS: 100.0000 mg | ORAL_CAPSULE | Freq: Two times a day (BID) | ORAL | 1 refills | Status: DC

## 2022-06-10 MED ORDER — LIDOCAINE HCL 1 % IJ SOLN
INTRAMUSCULAR | Status: AC
  Administered 2022-06-10: 10 mL
  Filled 2022-06-10: qty 20

## 2022-06-10 NOTE — Progress Notes (Signed)
Palliative Medicine [facility]  Telephone:([phone]) [phone] Fax:(336) [phone]   Name: Robin Bryant Date: 06/10/2022 MRN: [id number]  DOB: 02/16/1951  Patient Care Team: Tamsen Roers, NP as PCP - General    INTERVAL HISTORY: Robin Bryant is a 72 y.o. female with oncologic medical history including hypertension and lung cancer (04/2021).  Palliative ask to see for symptom management, pain management, and goals of care.   SOCIAL HISTORY:     reports that she quit smoking about 14 months ago. Her smoking use included cigarettes. She has a 25.00 pack-year smoking history. She has never used smokeless tobacco. She reports that she does not drink alcohol and does not use drugs.  ADVANCE DIRECTIVES:    CODE STATUS:   PAST MEDICAL HISTORY: Past Medical History:  Diagnosis Date   Back pain    Cancer (HCC)    History of radiation therapy    Left Lung, Right Lung 06/27/21-07/10/21- Dr. Antony Blackbird   HTN (hypertension)    Scoliosis    Sinus congestion     ALLERGIES:  is allergic to influenza vaccines and other.  MEDICATIONS:  Current Outpatient Medications  Medication Sig Dispense Refill   aspirin EC 81 MG tablet Take 81 mg by mouth every other day. Swallow whole.     Biotin 5000 MCG TABS Take 5,000 mcg by mouth daily.     Cholecalciferol (VITAMIN D-3) 125 MCG (5000 UT) TABS Take 5,000 Units by mouth daily.     fluticasone (FLONASE) 50 MCG/ACT nasal spray Place 1 spray into both nostrils daily as needed for allergies.     gabapentin (NEURONTIN) 800 MG tablet Take 800 mg by mouth 4 (four) times daily.     levothyroxine (SYNTHROID) 25 MCG tablet Take 25 mcg by mouth daily before breakfast.     magic mouthwash w/lidocaine SOLN Take 5 mLs by mouth 4 (four) times daily. Viscous Lidocaine 80 ml; Diphenhydramine 80 mls; Maalox 80 mls 240 mL 2   metoprolol succinate (TOPROL-XL) 100 MG 24 hr tablet Take 100 mg by mouth daily. Take with or immediately  following a meal.     morphine (MS CONTIN) 15 MG 12 hr tablet Take 1 tablet (15 mg total) by mouth every 12 (twelve) hours. (Patient taking differently: Take 15 mg by mouth daily as needed for pain.) 60 tablet 0   Multiple Vitamin (MULTIVITAMIN WITH MINERALS) TABS tablet Take 1 tablet by mouth daily.     ondansetron (ZOFRAN) 8 MG tablet Take 1 tablet (8 mg total) by mouth every 8 (eight) hours as needed for nausea or vomiting. 30 tablet 3   oxyCODONE (OXY IR/ROXICODONE) 5 MG immediate release tablet Take 1 tablet (5 mg total) by mouth every 4 (four) hours as needed for severe pain. 90 tablet 0   Polyethylene Glycol 400 (BLINK TEARS) 0.25 % SOLN Place 1 drop into both eyes daily as needed (For dry eyes).     tretinoin (RETIN-A) 0.05 % cream Apply 1 Application topically daily as needed (For rash).     vitamin B-12 (CYANOCOBALAMIN) 1000 MCG tablet Take 1,000 mcg by mouth daily.     No current facility-administered medications for this visit.    VITAL SIGNS: There were no vitals taken for this visit. There were no vitals filed for this visit.  Estimated body mass index is 14.25 kg/m as calculated from the following:   Height as of 04/22/22: 5' 9.5" (1.765 m).   Weight as of 05/27/22: 97 lb 14.4 oz (  44.4 kg).   PERFORMANCE STATUS (ECOG) : 1 - Symptomatic but completely ambulatory  IMPRESSION: I connected with Robin Bryant by phone for symptom follow-up. Endorses fatigue. Is emotional as she has been unable to work as she desires and concerned about rapidly accumulating fluid. Emotional support provided. Patient is scheduled later today for therapeutic thoracentesis.   Appetite is fair. We discussed her pain at length. She is complaining of chest wall pain which radiates down shoulders and back. Some relief with medications however pain seems to have increased over the past 3-4 days. I have recommended she increase oxycodone to 1-2 tablets as needed for breakthrough pain. She has been taking MS Contin  as prescribed every 12 hours over the past 2 weeks. Education provided on use of celebrex for additional support. I provided education on potential side effects and use of medication. She verbalized understanding.   We discussed Her current illness and what it means in the larger context of Her on-going co-morbidities. Natural disease trajectory and expectations were discussed.  Robin Bryant is emotional. She shares her concerns of quality of life. She verbalizes understanding of no further oncological interventions. She wishes to take things one day at a time. Robin Bryant shares her desire is to return back to work and have some "normalcy" of life for as long as she can. I had open and honest discussion with her remaining as realistic as possible about expectations. She verbalized understanding. Patient shares she knows at some point her health will only decline further to the point she is unable to care for herself. She is fearful this happening sooner than later.   I empathetically discussed comfort focused care and hospice support in the home. Education provided on residential hospice if symptoms become difficult to manage in the home. She verbalized understanding. Robin Bryant and her significant other would like to continue taking things one day at a time with aggressive symptom management. Not interested in hospice referral as of yet but again acknowledge this may be needed in the future.   I discussed the importance of continued conversation with family and their medical providers regarding overall plan of care and treatment options, ensuring decisions are within the context of the patients values and GOCs.  PLAN:  MS Contin 15 mg every 12 hours.  Patient has been taking as prescribed over the past 1-2 weeks.  Gabapentin 800 mg 4 times daily as previously prescribed. Zofran as needed for nausea Oxycodone 5-10mg  every 4-6 hrs for breakthrough pain Celebrex 100mg  twice daily Patient scheduled for  thoracentesis today. Previous was on 1/12 yielding 1.6 liters.  I will plan to see patient back in 1-2 weeks in collaboration to other oncology appointments.    Patient expressed understanding and was in agreement with this plan. She also understands that She can call the clinic at any time with any questions, concerns, or complaints.   Any controlled substances utilized were prescribed in the context of palliative care. PDMP has been reviewed.   Time Total: 45 min   Visit consisted of counseling and education dealing with the complex and emotionally intense issues of symptom management and palliative care in the setting of serious and potentially life-threatening illness.Greater than 50%  of this time was spent counseling and coordinating care related to the above assessment and plan.  3/12, AGPCNP-BC  Palliative Medicine Team/[facility]

## 2022-06-10 NOTE — Procedures (Signed)
PROCEDURE SUMMARY:  Successful US guided right thoracentesis. Yielded 1.6L of pleural fluid. Pt tolerated procedure well. No immediate complications.  Specimen not sent for labs. CXR ordered.  EBL < 5 mL  Monnica Saltsman PA-C 06/10/2022 4:25 PM

## 2022-06-11 ENCOUNTER — Other Ambulatory Visit: Payer: Self-pay

## 2022-06-11 DIAGNOSIS — C3431 Malignant neoplasm of lower lobe, right bronchus or lung: Secondary | ICD-10-CM

## 2022-06-11 DIAGNOSIS — Z515 Encounter for palliative care: Secondary | ICD-10-CM

## 2022-06-11 DIAGNOSIS — C3490 Malignant neoplasm of unspecified part of unspecified bronchus or lung: Secondary | ICD-10-CM

## 2022-06-11 NOTE — Progress Notes (Signed)
Patient called to advise that when she had her thoracentesis on 06/10/22 they suggested that she reach out to Dr. Leonides Schanz to request thoracentesis on Friday 06/14/22 "so that it can be completely drained." Spoke with Dr. Leonides Schanz who agreed.

## 2022-06-11 NOTE — Progress Notes (Unsigned)
Order for plurex cath placed per Lowella Bandy, NP

## 2022-06-14 ENCOUNTER — Ambulatory Visit (HOSPITAL_COMMUNITY)
Admission: RE | Admit: 2022-06-14 | Discharge: 2022-06-14 | Disposition: A | Discharge status: Home / Self Care | Payer: Medicare Other | Source: Ambulatory Visit | Attending: Hematology and Oncology | Admitting: Hematology and Oncology

## 2022-06-14 ENCOUNTER — Ambulatory Visit (HOSPITAL_COMMUNITY)
Admission: RE | Admit: 2022-06-14 | Discharge: 2022-06-14 | Disposition: A | Discharge status: Home / Self Care | Payer: Medicare Other | Source: Ambulatory Visit | Attending: Radiology | Admitting: Radiology

## 2022-06-14 VITALS — BP 139/82

## 2022-06-14 DIAGNOSIS — C3431 Malignant neoplasm of lower lobe, right bronchus or lung: Secondary | ICD-10-CM | POA: Diagnosis present

## 2022-06-14 DIAGNOSIS — J90 Pleural effusion, not elsewhere classified: Secondary | ICD-10-CM | POA: Insufficient documentation

## 2022-06-14 HISTORY — PX: IR THORACENTESIS ASP PLEURAL SPACE W/IMG GUIDE: IMG5380

## 2022-06-14 MED ORDER — LIDOCAINE HCL 1 % IJ SOLN
INTRAMUSCULAR | Status: AC
  Filled 2022-06-14: qty 20

## 2022-06-14 NOTE — Procedures (Addendum)
Ultrasound-guided diagnostic right sided thoracentesis performed yielding 1.5 liters of amber colored fluid. Small post procedural pnemothorax. Case discussed with IR Attending Dr. Henreitta Leber. Patient states her respiratory status has improved s/p thoracentesis. Patient has elected to go home. Should her respiratory status change the patient should call 911 and go to the nearest ED. Patient verbalized understanding and is agreement with the plan of care.EBL is < 2 ml.

## 2022-06-17 ENCOUNTER — Inpatient Hospital Stay (HOSPITAL_BASED_OUTPATIENT_CLINIC_OR_DEPARTMENT_OTHER): Payer: Medicare Other | Admitting: Nurse Practitioner

## 2022-06-17 ENCOUNTER — Encounter: Payer: Self-pay | Admitting: Nurse Practitioner

## 2022-06-17 DIAGNOSIS — M792 Neuralgia and neuritis, unspecified: Secondary | ICD-10-CM | POA: Diagnosis not present

## 2022-06-17 DIAGNOSIS — Z515 Encounter for palliative care: Secondary | ICD-10-CM | POA: Diagnosis not present

## 2022-06-17 DIAGNOSIS — R630 Anorexia: Secondary | ICD-10-CM

## 2022-06-17 DIAGNOSIS — G893 Neoplasm related pain (acute) (chronic): Secondary | ICD-10-CM | POA: Diagnosis not present

## 2022-06-17 DIAGNOSIS — R530 Neoplastic (malignant) related fatigue: Secondary | ICD-10-CM

## 2022-06-17 NOTE — Progress Notes (Signed)
[place]  [phone] [phone]   Name: Robin Bryant Date: 06/17/2022 MRN: [id number]  DOB: May 29, 1950  Patient Care Team: [name], NP as PCP - General   I connected with Caroline More on 06/17/22 at 12:00 PM EST by phone and verified that I am speaking with the correct person using two identifiers.   I discussed the limitations, risks, security and privacy concerns of performing an evaluation and management service by telemedicine and the availability of in-person appointments. I also discussed with the patient that there may be a patient responsible charge related to this service. The patient expressed understanding and agreed to proceed.   Other persons participating in the visit and their role in the encounter: [place], RN   Patient's location: home  Provider's location: [facility]   Chief Complaint: f/u of symptom management    INTERVAL HISTORY: Robin Bryant is a 72 y.o. female with oncologic medical history including hypertension and lung cancer (04/2021).  Palliative ask to see for symptom management, pain management, and goals of care.   SOCIAL HISTORY:     reports that she quit smoking about 14 months ago. Her smoking use included cigarettes. She has a 25.00 pack-year smoking history. She has never used smokeless tobacco. She reports that she does not drink alcohol and does not use drugs.  ADVANCE DIRECTIVES:    CODE STATUS:   PAST MEDICAL HISTORY: Past Medical History:  Diagnosis Date   Back pain    Cancer ([place])    History of radiation therapy    Left Lung, Right Lung 06/27/21-07/10/21- Dr. Gery Pray   HTN (hypertension)    Scoliosis    Sinus congestion     ALLERGIES:  is allergic to influenza vaccines and other.  MEDICATIONS:  Current Outpatient Medications  Medication Sig Dispense Refill   aspirin EC 81 MG tablet Take 81 mg by mouth every other day. Swallow whole.      Biotin 5000 MCG TABS Take 5,000 mcg by mouth daily.     celecoxib (CELEBREX) 100 MG capsule Take 1 capsule (100 mg total) by mouth 2 (two) times daily. 60 capsule 1   Cholecalciferol (VITAMIN D-3) 125 MCG (5000 UT) TABS Take 5,000 Units by mouth daily.     fluticasone (FLONASE) 50 MCG/ACT nasal spray Place 1 spray into both nostrils daily as needed for allergies.     gabapentin (NEURONTIN) 800 MG tablet Take 800 mg by mouth 4 (four) times daily.     levothyroxine (SYNTHROID) 25 MCG tablet Take 25 mcg by mouth daily before breakfast.     magic mouthwash w/lidocaine SOLN Take 5 mLs by mouth 4 (four) times daily. Viscous Lidocaine 80 ml; Diphenhydramine 80 mls; Maalox 80 mls 240 mL 2   metoprolol succinate (TOPROL-XL) 100 MG 24 hr tablet Take 100 mg by mouth daily. Take with or immediately following a meal.     morphine (MS CONTIN) 15 MG 12 hr tablet Take 1 tablet (15 mg total) by mouth every 12 (twelve) hours. (Patient taking differently: Take 15 mg by mouth daily as needed for pain.) 60 tablet 0   Multiple Vitamin (MULTIVITAMIN WITH MINERALS) TABS tablet Take 1 tablet by mouth daily.     ondansetron (ZOFRAN) 8 MG tablet Take 1 tablet (8 mg total) by mouth every 8 (eight) hours as needed for nausea or vomiting. 30 tablet 3   oxyCODONE (OXY IR/ROXICODONE) 5 MG immediate release tablet Take 1 tablet (5 mg total)  by mouth every 4 (four) hours as needed for severe pain. 90 tablet 0   Polyethylene Glycol 400 (BLINK TEARS) 0.25 % SOLN Place 1 drop into both eyes daily as needed (For dry eyes).     tretinoin (RETIN-A) 0.05 % cream Apply 1 Application topically daily as needed (For rash).     vitamin B-12 (CYANOCOBALAMIN) 1000 MCG tablet Take 1,000 mcg by mouth daily.     No current facility-administered medications for this visit.    VITAL SIGNS: There were no vitals taken for this visit. There were no vitals filed for this visit.  Estimated body mass index is 14.25 kg/m as calculated from the  following:   Height as of 04/22/22: 5' 9.5" (1.765 m).   Weight as of 05/27/22: 97 lb 14.4 oz (44.4 kg).   PERFORMANCE STATUS (ECOG) : 1 - Symptomatic but completely ambulatory  IMPRESSION:  I connected with Robin Bryant and her Significant Other Mr. Rodman Comp for follow-up. Robin Bryant shares she is trying to remain as active as possible. She is emotional expressing she does not seem to be getting any better. Emotional support provided.   I discussed at length with patient symptoms and disease trajectory. She and Mr. Manus Rudd verbalized understanding. Robin Bryant states she becomes quite dyspneic with the least bit of exertion. She does feel better for 1-2 days after thoracentesis. She tries to do as much as she can during those days. We discussed listening to her body and not overdoing things. She is not wearing her oxygen continuously although her reported symptoms warrant this. Advised that she will need to wear oxygen to support her dyspnea.   She is scheduled for Pleurx catheter placement on 2/1 which she is looking forward to. She does have some anxiety about having the supplies she need at home. I have advised orders have been completed and sent over. They verbalized understanding.   Appetite is fair. We discussed her pain at length. Her pain continues to be around  chest wall and radiating down shoulders and back. She feels pain is controlled on regimen however has noticed some increase in drowsiness since increasing oxycodone to 10mg . I advised to decrease back down to 5mg  and we will continue to closely evaluate. Tolerating MS Contin every 12 hours.   I discussed the importance of continued conversation with family and their medical providers regarding overall plan of care and treatment options, ensuring decisions are within the context of the patients values and GOCs.  PLAN:  MS Contin 15 mg every 12 hours.  Gabapentin 800 mg 4 times daily as previously prescribed. Zofran as needed for  nausea Oxycodone 5mg  every 4-6 hrs for breakthrough pain Celebrex 100mg  twice daily Patient scheduled for Pleurx placement on 2/1. Orders have been sent for home supplies. I will plan to see patient back in 1-2 weeks in collaboration to other oncology appointments.    Patient expressed understanding and was in agreement with this plan. She also understands that She can call the clinic at any time with any questions, concerns, or complaints.   Time Total: 50 min   Visit consisted of counseling and education dealing with the complex and emotionally intense issues of symptom management and palliative care in the setting of serious and potentially life-threatening illness.Greater than 50%  of this time was spent counseling and coordinating care related to the above assessment and plan.  Alda Lea, AGPCNP-BC  Palliative Medicine Team/[place]

## 2022-06-19 ENCOUNTER — Other Ambulatory Visit: Payer: Self-pay | Admitting: Radiology

## 2022-06-19 DIAGNOSIS — J910 Malignant pleural effusion: Secondary | ICD-10-CM

## 2022-06-20 ENCOUNTER — Other Ambulatory Visit: Payer: Self-pay | Admitting: Nurse Practitioner

## 2022-06-20 ENCOUNTER — Ambulatory Visit (HOSPITAL_COMMUNITY)
Admission: RE | Admit: 2022-06-20 | Discharge: 2022-06-20 | Disposition: A | Discharge status: Home / Self Care | Payer: Medicare Other | Source: Ambulatory Visit

## 2022-06-20 ENCOUNTER — Other Ambulatory Visit: Payer: Self-pay

## 2022-06-20 ENCOUNTER — Other Ambulatory Visit (HOSPITAL_BASED_OUTPATIENT_CLINIC_OR_DEPARTMENT_OTHER): Payer: Medicare Other | Admitting: Nurse Practitioner

## 2022-06-20 ENCOUNTER — Ambulatory Visit (HOSPITAL_COMMUNITY)
Admission: RE | Admit: 2022-06-20 | Discharge: 2022-06-20 | Disposition: A | Discharge status: Home / Self Care | Payer: Medicare Other | Source: Ambulatory Visit | Attending: Nurse Practitioner | Admitting: Nurse Practitioner

## 2022-06-20 ENCOUNTER — Encounter (HOSPITAL_COMMUNITY): Payer: Self-pay

## 2022-06-20 DIAGNOSIS — Z7189 Other specified counseling: Secondary | ICD-10-CM

## 2022-06-20 DIAGNOSIS — Z87891 Personal history of nicotine dependence: Secondary | ICD-10-CM | POA: Insufficient documentation

## 2022-06-20 DIAGNOSIS — R531 Weakness: Secondary | ICD-10-CM

## 2022-06-20 DIAGNOSIS — C3431 Malignant neoplasm of lower lobe, right bronchus or lung: Secondary | ICD-10-CM | POA: Insufficient documentation

## 2022-06-20 DIAGNOSIS — R918 Other nonspecific abnormal finding of lung field: Secondary | ICD-10-CM | POA: Diagnosis not present

## 2022-06-20 DIAGNOSIS — Z515 Encounter for palliative care: Secondary | ICD-10-CM

## 2022-06-20 DIAGNOSIS — I10 Essential (primary) hypertension: Secondary | ICD-10-CM | POA: Insufficient documentation

## 2022-06-20 DIAGNOSIS — G893 Neoplasm related pain (acute) (chronic): Secondary | ICD-10-CM

## 2022-06-20 DIAGNOSIS — J910 Malignant pleural effusion: Secondary | ICD-10-CM

## 2022-06-20 DIAGNOSIS — C3490 Malignant neoplasm of unspecified part of unspecified bronchus or lung: Secondary | ICD-10-CM

## 2022-06-20 DIAGNOSIS — Z66 Do not resuscitate: Secondary | ICD-10-CM | POA: Diagnosis not present

## 2022-06-20 HISTORY — PX: IR PERC PLEURAL DRAIN W/INDWELL CATH W/IMG GUIDE: IMG5383

## 2022-06-20 LAB — BASIC METABOLIC PANEL
Anion gap: 8 (ref 5–15)
BUN: 29 mg/dL — ABNORMAL HIGH (ref 8–23)
CO2: 32 mmol/L (ref 22–32)
Calcium: 8.6 mg/dL — ABNORMAL LOW (ref 8.9–10.3)
Chloride: 98 mmol/L (ref 98–111)
Creatinine, Ser: 1.2 mg/dL — ABNORMAL HIGH (ref 0.44–1.00)
GFR, Estimated: 48 mL/min — ABNORMAL LOW (ref >60)
Glucose, Bld: 92 mg/dL (ref 70–99)
Potassium: 5.2 mmol/L — ABNORMAL HIGH (ref 3.5–5.1)
Sodium: 138 mmol/L (ref 135–145)

## 2022-06-20 LAB — CBC WITH DIFFERENTIAL/PLATELET
Abs Immature Granulocytes: 0.07 10*3/uL (ref 0.00–0.07)
Basophils Absolute: 0.1 10*3/uL (ref 0.0–0.1)
Basophils Relative: 1 %
Eosinophils Absolute: 0.4 10*3/uL (ref 0.0–0.5)
Eosinophils Relative: 3 %
HCT: 42.2 % (ref 36.0–46.0)
Hemoglobin: 12.9 g/dL (ref 12.0–15.0)
Immature Granulocytes: 1 %
Lymphocytes Relative: 6 %
Lymphs Abs: 0.7 10*3/uL (ref 0.7–4.0)
MCH: 29.9 pg (ref 26.0–34.0)
MCHC: 30.6 g/dL (ref 30.0–36.0)
MCV: 97.9 fL (ref 80.0–100.0)
Monocytes Absolute: 1.3 10*3/uL — ABNORMAL HIGH (ref 0.1–1.0)
Monocytes Relative: 11 %
Neutro Abs: 9 10*3/uL — ABNORMAL HIGH (ref 1.7–7.7)
Neutrophils Relative %: 78 %
Platelets: 480 10*3/uL — ABNORMAL HIGH (ref 150–400)
RBC: 4.31 MIL/uL (ref 3.87–5.11)
RDW: 14.5 % (ref 11.5–15.5)
WBC: 11.5 10*3/uL — ABNORMAL HIGH (ref 4.0–10.5)
nRBC: 0 % (ref 0.0–0.2)

## 2022-06-20 LAB — PROTIME-INR
INR: 1.1 (ref 0.8–1.2)
Prothrombin Time: 13.7 seconds (ref 11.4–15.2)

## 2022-06-20 MED ORDER — LIDOCAINE HCL 1 % IJ SOLN
20.0000 mL | Freq: Once | INTRAMUSCULAR | Status: AC
  Administered 2022-06-20: 10 mL

## 2022-06-20 MED ORDER — CEFAZOLIN SODIUM-DEXTROSE 2-4 GM/100ML-% IV SOLN
2.0000 g | INTRAVENOUS | Status: AC
  Administered 2022-06-20: 2 g via INTRAVENOUS

## 2022-06-20 MED ORDER — MIDAZOLAM HCL 2 MG/2ML IJ SOLN
INTRAMUSCULAR | Status: DC | PRN
  Administered 2022-06-20 (×2): 1 mg via INTRAVENOUS

## 2022-06-20 MED ORDER — SODIUM CHLORIDE 0.9 % IV SOLN: INTRAVENOUS | Status: DC

## 2022-06-20 MED ORDER — MIDAZOLAM HCL 2 MG/2ML IJ SOLN
INTRAMUSCULAR | Status: AC
  Filled 2022-06-20: qty 2

## 2022-06-20 MED ORDER — CEFAZOLIN SODIUM-DEXTROSE 2-4 GM/100ML-% IV SOLN
INTRAVENOUS | Status: AC
  Filled 2022-06-20: qty 100

## 2022-06-20 MED ORDER — FENTANYL CITRATE (PF) 100 MCG/2ML IJ SOLN
INTRAMUSCULAR | Status: AC
  Filled 2022-06-20: qty 2

## 2022-06-20 MED ORDER — FENTANYL CITRATE (PF) 100 MCG/2ML IJ SOLN
INTRAMUSCULAR | Status: DC | PRN
  Administered 2022-06-20: 50 ug via INTRAVENOUS
  Administered 2022-06-20 (×2): 25 ug via INTRAVENOUS

## 2022-06-20 NOTE — Progress Notes (Signed)
Patient successfully had Pleurx Catheter placed today. Thoracentesis yielded 1.8L today. Orders for home supplies completed and patient has received prior to discharge. Home Health orders for education, drain management, and support placed. Faxed to [facility] home [facility] in [place]. Office number is [phone].

## 2022-06-20 NOTE — Progress Notes (Addendum)
I connected with Frank Pilger on 06/20/22 at  by phone and verified that I am speaking with the correct person using two identifiers.   I discussed the limitations, risks, security and privacy concerns of performing an evaluation and management service by telemedicine and the availability of in-person appointments. I also discussed with the patient that there may be a patient responsible charge related to this service. The patient expressed understanding and agreed to proceed.   Other persons participating in the visit and their role in the encounter: [name] (Significant other)    Patient's location: Home   Provider's location: [facility] [place]   Chief Complaint: Follow-up  I connected with Ms. Bonebrake by phone. She successfully had Pleurx Catheter placed this morning. 1.2L yielded after placement. Patient tolerated well. She is home and resting. Mr. Manus Rudd is present for support. Denies significant pain. Some mild discomfort.   We discussed home care needs and ongoing overall functional decline. She is less dyspneic today which she shares is the usual case once she had a thoracentesis. Says she can do well for a few days with minimal home oxygen use and will then begin to notice increased in shortness of breath specifically with exertion.   Mysty endorses ongoing fatigue, decrease in her appetite. She is emotional expressing her understanding of disease trajectory. Feels at peace that she has attempted all available treatments and her quality of life is most important to her at this phase in her life. Emotional support provided.   I empathetically approached discussions regarding hospice support to allow for additional home support as well as symptom management. She and Mr. Donnetta Hutching verbalized wishes to enroll in hospice support. She understands that this does not mean she is passing away days to weeks however symptoms will continue to escalate leading to a more end-of-life situation. Patient verbalized  understanding.   Ms. Crocker is clear in her expressed wishes to focus on her comfort and symptom management. Taking life one day at a time. Making memories and enjoying the good moments that she may have.   Education provided on outpatient hospice support and their philosophy of care. Expectations of care in the home were discussed.   Patient confirms DNR/DNI.   I will make referral to [facility] hospice who service the [place] area and [facility] were she leaves. She verbalized understanding.   Plan:  MS Contin 15 mg every 12 hours.  Gabapentin 800 mg 4 times daily as previously prescribed. Zofran as needed for nausea Oxycodone 5mg  every 4-6 hrs for breakthrough pain Celebrex 100mg  twice daily  Pleurx placement on 2/1, tolerated well. Yielded 1.2L with thoracentesis after placement. Sent home per IR with supplies.  Initial home health was set-up with [facility] however will transfer over to [facility].   Any controlled substances utilized were prescribed in the context of palliative care. PDMP has been reviewed.    Time Total: 45 min   Visit consisted of counseling and education dealing with the complex and emotionally intense issues of symptom management and palliative care in the setting of serious and potentially life-threatening illness.Greater than 50%  of this time was spent counseling and coordinating care related to the above assessment and plan.  Alda Lea, AGPCNP-BC  Palliative Medicine Team/[place]

## 2022-06-20 NOTE — Progress Notes (Signed)
Order placed by Lexine Baton, NP for hospice with [facility] hospice services. Contacted Freddie Breech, Rn hospice liaison to provide order/referral information.

## 2022-06-20 NOTE — Discharge Instructions (Signed)
Please call Interventional Radiology clinic [phone] with any questions or concerns.  You may remove your dressing and shower tomorrow.  PLEASE INFORM ANY HOME CARE PROVIDERS THAT YOU WERE DRAINED DURING YOUR PROCEDURE. A TOTAL OF 1.5 L. IF TEACHING IS PROVIDED WITHIN THE NEXT 4 DAYS PLEASE LET THEM KNOW FLUID REMOVAL SHOULD JUST BE FOR TEACHING PURPOSES.   Indwelling Pleural Catheter Home Guide (Pleurex Drain)  An indwelling pleural catheter is a thin, flexible tube that is inserted under your skin and into your chest. The catheter drains excess fluid that collects in the area between the chest wall and the lungs (pleural space). After the catheter is inserted, it can be attached to a bottle that collects fluid. The pleural catheter will allow you to drain fluid from your chest at home on a regular basis (sometimes daily). This will eliminate the need for frequent visits to the hospital or clinic to drain the fluid. The catheter may be removed after the excess fluid problem is resolved, usually after 2-3 months. It is important to follow instructions from your health care provider about how to drain and care for your catheter. What are the risks? Generally, this is a safe procedure. However, problems may occur, including: Infection. Skin damage around the catheter. Lung damage. Failure of the chest tube to work properly. Spreading of cancer cells along the catheter, if you have cancer. Supplies needed: Vacuum-sealed drainage bottle with attached drainage line. Sterile dressing. Sterile alcohol pads. Sterile gloves. Valve cap. Sterile gauze pads, 4  4 inch (10 cm  10 cm). Tape. Adhesive dressing. Sterile foam catheter pad. NEVER USE SCISSORS NEAR OR AROUND THE CATHETER, THIS INCLUDES TO REMOVE A BANDAGE How to care for your catheter and insertion site Wash your hands with soap and warm water before and after touching the catheter or insertion site. If soap and water are not  available, use hand sanitizer. Check your bandage (dressing) daily to make sure it is clean and dry. Keep the skin around the catheter clean and dry. Check the catheter regularly for any cracks or kinks in the tubing. Check your catheter insertion site every day for signs of infection. Check for: Skin breakdown. Redness, swelling, or pain. Fluid or blood. Warmth. Pus or a bad smell. How to drain your catheter You may need to drain your catheter every day, or more or less often as told by your health care provider. Follow instructions from your health care provider about how to drain your catheter. You may also refer to instructions that come with the drainage system. To drain the catheter: Wash your hands with soap and warm water. If soap and water are not available, use hand sanitizer. Carefully remove the dressing from around the catheter. Wash your hands again. Put on the gloves provided. Prepare the vacuum-sealed drainage bottle and drainage line. Close the drainage line of the vacuum-sealed drainage bottle by squeezing the pinch clamp or rolling the wheel of the roller clamp toward the bottle. The vacuum in the bottle will be lost if the line is not closed completely. Remove the access tip cover from the drainage line. Do not touch the end. Set it on a sterile surface. Remove the catheter valve cap and throw it away. Use an alcohol pad to clean the end of the catheter. Insert the access tip into the catheter valve. Make sure the valve and access tip are securely connected. Listen for a click to confirm that they are connected. Insert the T plunger to break  the vacuum seal on the drainage bottle. Open the clamp on the drainage line. Allow the catheter to drain. Keep the catheter and the drainage bottle below the level of your chest. There may be a one-way valve on the end of the tubing that will allow liquid and air to flow out of the catheter without letting air inside. Drain the amount  of fluid as told by your health care provider. It usually takes 5-15 minutes. Do not drain more than 1000 mL of fluid. You may feel a little discomfort while you are draining. If the pain is severe, stop draining and contact your health care provider. After you finish draining the catheter, remove the drainage bottle tubing from the catheter. Use a clean alcohol pad to wipe the catheter tip. Place a clean cap on the end of the catheter. Use an alcohol pad to clean the skin around the catheter. Allow the skin to air-dry. Put the catheter pad on your skin. Curl the catheter into loops and place it on the pad. Do not place the catheter on your skin. Replace the dressing over the catheter. Discard the drainage bottle as instructed by your health care provider. Do not reuse the drainage bottle. How to change your dressing Change your dressing at least once a week, or more often if needed to keep the dressing dry. Be sure to change the dressing whenever it becomes moist. Your health care provider will tell you how often to change your dressing. Wash your hands with soap and warm water. If soap and water are not available, use hand sanitizer. Gently remove the old dressing. Avoid using scissors to remove the dressing. Sharp objects may damage the catheter. Wash the skin around the insertion site with mild, fragrance-free soap and warm water. Rinse well, then pat the area dry with a clean cloth. Check the skin around the catheter for signs of infection. Check for: Skin breakdown. Redness, swelling, or pain. Fluid or blood. Warmth. Pus or a bad smell. If your catheter was stitched (sutured) to your skin, look at the suture to make sure it is still anchored in your skin. Do not apply creams, ointments, or alcohol to the area. Let your skin air-dry completely before you apply a new dressing. Curl the catheter into loops and place it on the sterile catheter pad. Do not place the catheter on your skin. If  you do not have a pad, use a clean dressing. Slide the dressing under the disk that holds the drainage catheter in place. Use gauze to cover the catheter and the catheter pad. The catheter should rest on the pad or dressing, not on your skin. Tape the dressing to your skin. You may be instructed to use an adhesive dressing covering instead of gauze and tape. Wash your hands with soap and warm water. If soap and water are not available, use hand sanitizer. General recommendations Always wash your hands with soap and warm water before and after caring for your catheter and drainage bottle. Use a mild, fragrance-free soap. If soap and water are not available, use hand sanitizer. Always make sure there are no leaks in the catheter or drainage bottle. Each time you drain the catheter, note the color and amount of fluid. Do not touch the tip of the catheter or the drainage bottle tubing. Do not reuse drainage bottles. Do not take baths, swim, or use a hot tub until your health care provider approves. Ask your health care provider if you may take  showers. You may only be allowed to take sponge baths. Take deep breaths regularly, followed by a cough. Doing this can help to prevent lung infection. Contact a health care provider if: You have any questions about caring for your catheter or drainage bottle. You still have pain at the catheter insertion site more than 2 days after your procedure. You have pain while draining your catheter. Your catheter becomes bent, twisted, or cracked. The connection between the catheter and the collection bottle becomes loose. You have any of these around your catheter insertion site or coming from it: Skin breakdown. Redness, swelling, or pain. Fluid or blood. Warmth. Pus or a bad smell. Get help right away if: You have a fever or chills. You have chest pain. You have dizziness or shortness of breath. You have severe redness, swelling, or pain at your catheter  insertion site. The catheter comes out. The catheter is blocked or clogged. Summary An indwelling pleural catheter is a thin, flexible tube that is inserted under your skin and into your chest. The catheter drains excess fluid that collects in the area between the chest wall and the lungs (pleural space). It is important to follow instructions from your health care provider about how to drain and care for your catheter. Do not touch the tip of the catheter or the drainage bottle tubing. Always wash your hands with soap and water before and after caring for your catheter and drainage bottle. If soap and water are not available, use hand sanitizer. This information is not intended to replace advice given to you by your health care provider. Make sure you discuss any questions you have with your health care provider. Document Revised: 08/28/2018 Document Reviewed: 08/29/2016 Elsevier Patient Education  [place].  Moderate Conscious Sedation, Adult, Care After This sheet gives you information about how to care for yourself after your procedure. Your health care provider may also give you more specific instructions. If you have problems or questions, contact your health careprovider. What can I expect after the procedure? After the procedure, it is common to have: Sleepiness for several hours. Impaired judgment for several hours. Difficulty with balance. Vomiting if you eat too soon. Follow these instructions at home: For the time period you were told by your health care provider: Rest. Do not participate in activities where you could fall or become injured. Do not drive or use machinery. Do not drink alcohol. Do not take sleeping pills or medicines that cause drowsiness. Do not make important decisions or sign legal documents. Do not take care of children on your own. Eating and drinking  Follow the diet recommended by your health care provider. Drink enough fluid to keep your  urine pale yellow. If you vomit: Drink water, juice, or soup when you can drink without vomiting. Make sure you have little or no nausea before eating solid foods.  General instructions Take over-the-counter and prescription medicines only as told by your health care provider. Have a responsible adult stay with you for the time you are told. It is important to have someone help care for you until you are awake and alert. Do not smoke. Keep all follow-up visits as told by your health care provider. This is important. Contact a health care provider if: You are still sleepy or having trouble with balance after 24 hours. You feel light-headed. You keep feeling nauseous or you keep vomiting. You develop a rash. You have a fever. You have redness or swelling around the IV  site. Get help right away if: You have trouble breathing. You have new-onset confusion at home. Summary After the procedure, it is common to feel sleepy, have impaired judgment, or feel nauseous if you eat too soon. Rest after you get home. Know the things you should not do after the procedure. Follow the diet recommended by your health care provider and drink enough fluid to keep your urine pale yellow. Get help right away if you have trouble breathing or new-onset confusion at home. This information is not intended to replace advice given to you by your health care provider. Make sure you discuss any questions you have with your healthcare provider. Document Revised: 09/03/2019 Document Reviewed: 04/01/2019 Elsevier Patient Education  2022 [employer].

## 2022-06-20 NOTE — Progress Notes (Signed)
Patient completed Pleurx drain placement today in [facility]. Per report 1.5 L of fluid removed during procedure and patient will not need drained for 4-5 days. This is included in discharge teaching and patient is aware to let any home health providers know this info as well. Jobe Gibbon, NP is aware pt will be going home at 11A and is working on home health to visit patient to do drain teaching for home.  Supplies ordered from [facility].

## 2022-06-20 NOTE — Procedures (Signed)
Interventional Radiology Procedure Note  Procedure: rt chest tunneled pleural drain(pleurX)  1200cc thora after placement    Complications: None  Estimated Blood Loss:  min  Findings: Full report in pacs     Tamera Punt, MD

## 2022-06-20 NOTE — H&P (Signed)
Chief Complaint: Patient was seen in consultation today for recurrent malignant pleural effusion  Referring Physician(s): Pickenpack-Cousar,Athena N  Supervising Physician: Daryll Brod  Patient Status: [facility] - Out-pt  History of Present Illness: Robin Bryant is a 72 y.o. female with PMH significant for lung cancer, hypertension, and recurrent right malignant pleural effusion being seen today for image-guided PleurX catheter placement to address her recurrent pleural effusions. The patient has been seen multiple times by IR for thoracentesis since September 2023. She is being followed by Palliative Care who are in agreement for patient to proceed with PleurX catheter placement.  Past Medical History:  Diagnosis Date   Back pain    Cancer ([place])    History of radiation therapy    Left Lung, Right Lung 06/27/21-07/10/21- Dr. Gery Pray   HTN (hypertension)    Scoliosis    Sinus congestion     Past Surgical History:  Procedure Laterality Date   ABDOMINAL HYSTERECTOMY     BACK SURGERY     BRONCHIAL BIOPSY  05/15/2021   Procedure: BRONCHIAL BIOPSIES;  Surgeon: Garner Nash, DO;  Location: [facility];  Service: Pulmonary;;   BRONCHIAL BRUSHINGS  05/15/2021   Procedure: BRONCHIAL BRUSHINGS;  Surgeon: Garner Nash, DO;  Location: [place];  Service: Pulmonary;;   BRONCHIAL NEEDLE ASPIRATION BIOPSY  05/15/2021   Procedure: BRONCHIAL NEEDLE ASPIRATION BIOPSIES;  Surgeon: Garner Nash, DO;  Location: [place];  Service: Pulmonary;;   BRONCHIAL WASHINGS  05/15/2021   Procedure: BRONCHIAL WASHINGS;  Surgeon: Garner Nash, DO;  Location: [place];  Service: Pulmonary;;   IR THORACENTESIS ASP PLEURAL SPACE W/IMG GUIDE  05/16/2022   IR THORACENTESIS ASP PLEURAL SPACE W/IMG GUIDE  05/31/2022   IR THORACENTESIS ASP PLEURAL SPACE W/IMG GUIDE  06/10/2022   IR THORACENTESIS ASP PLEURAL SPACE W/IMG GUIDE  06/14/2022   VIDEO BRONCHOSCOPY WITH RADIAL ENDOBRONCHIAL  ULTRASOUND  05/15/2021   Procedure: RADIAL ENDOBRONCHIAL ULTRASOUND;  Surgeon: Garner Nash, DO;  Location: [facility];  Service: Pulmonary;;    Allergies: Influenza vaccines and Other  Medications: Prior to Admission medications   Medication Sig Start Date End Date Taking? Authorizing Provider  Biotin 5000 MCG TABS Take 5,000 mcg by mouth daily.   Yes [provider]  Cholecalciferol (VITAMIN D-3) 125 MCG (5000 UT) TABS Take 5,000 Units by mouth daily.   Yes [provider]  gabapentin (NEURONTIN) 800 MG tablet Take 800 mg by mouth 4 (four) times daily. 02/13/21  Yes [provider]  levothyroxine (SYNTHROID) 25 MCG tablet Take 25 mcg by mouth daily before breakfast.   Yes [provider]  magic mouthwash w/lidocaine SOLN Take 5 mLs by mouth 4 (four) times daily. Viscous Lidocaine 80 ml; Diphenhydramine 80 mls; Maalox 80 mls 04/24/22  Yes Orson Slick, MD  metoprolol succinate (TOPROL-XL) 100 MG 24 hr tablet Take 100 mg by mouth daily. Take with or immediately following a meal.   Yes [provider]  morphine (MS CONTIN) 15 MG 12 hr tablet Take 1 tablet (15 mg total) by mouth every 12 (twelve) hours. Patient taking differently: Take 15 mg by mouth daily as needed for pain. 03/18/22  Yes Orson Slick, MD  Multiple Vitamin (MULTIVITAMIN WITH MINERALS) TABS tablet Take 1 tablet by mouth daily.   Yes [provider]  ondansetron (ZOFRAN) 8 MG tablet Take 1 tablet (8 mg total) by mouth every 8 (eight) hours as needed for nausea or vomiting. 05/30/22  Yes Pickenpack-Cousar,  Carlena Sax, NP  oxyCODONE (OXY IR/ROXICODONE) 5 MG immediate release tablet Take 1 tablet (5 mg total) by mouth every 4 (four) hours as needed for severe pain. 06/04/22  Yes Pickenpack-Cousar, Carlena Sax, NP  Polyethylene Glycol 400 (BLINK TEARS) 0.25 % SOLN Place 1 drop into both eyes daily as needed (For dry eyes).   Yes [provider]  tretinoin (RETIN-A)  0.05 % cream Apply 1 Application topically daily as needed (For rash). 07/16/21  Yes [provider]  vitamin B-12 (CYANOCOBALAMIN) 1000 MCG tablet Take 1,000 mcg by mouth daily.   Yes [provider]  aspirin EC 81 MG tablet Take 81 mg by mouth every other day. Swallow whole.    [provider]  celecoxib (CELEBREX) 100 MG capsule Take 1 capsule (100 mg total) by mouth 2 (two) times daily. 06/10/22   Pickenpack-Cousar, Carlena Sax, NP  fluticasone (FLONASE) 50 MCG/ACT nasal spray Place 1 spray into both nostrils daily as needed for allergies.    [provider]     Family History  Problem Relation Age of Onset   Alzheimer's disease Mother    Cancer Father    Lung cancer Father     Social History   Socioeconomic History   Marital status: Divorced    Spouse name: Not on file   Number of children: Not on file   Years of education: Not on file   Highest education level: Not on file  Occupational History   Not on file  Tobacco Use   Smoking status: Former    Packs/day: 0.50    Years: 50.00    Total pack years: 25.00    Types: Cigarettes    Quit date: 04/04/2021    Years since quitting: 1.2   Smokeless tobacco: Never  Vaping Use   Vaping Use: Never used  Substance and Sexual Activity   Alcohol use: Never   Drug use: Never   Sexual activity: Not on file  Other Topics Concern   Not on file  Social History Narrative   Not on file   Social Determinants of Health   Financial Resource Strain: Not on file  Food Insecurity: Not on file  Transportation Needs: Not on file  Physical Activity: Not on file  Stress: Not on file  Social Connections: Not on file     Review of Systems: A 12 point ROS discussed and pertinent positives are indicated in the HPI above.  All other systems are negative.  Review of Systems  Constitutional:  Positive for chills. Negative for fever.       Patient reports chills and sweat, predominantly at night   Respiratory:  Negative for chest tightness and shortness of breath.   Cardiovascular:  Positive for leg swelling. Negative for chest pain.  Gastrointestinal:  Negative for abdominal pain, diarrhea, nausea and vomiting.  Neurological:  Negative for dizziness and headaches.  Psychiatric/Behavioral:  Negative for confusion.     Vital Signs: BP (!) 155/69   Temp 98 F (36.7 C) (Oral)   SpO2 94%    Physical Exam Vitals reviewed.  Constitutional:      General: She is not in acute distress.    Appearance: She is ill-appearing.  HENT:     Mouth/Throat:     Mouth: Mucous membranes are moist.  Cardiovascular:     Rate and Rhythm: Normal rate and regular rhythm.     Pulses: Normal pulses.     Heart sounds: Normal heart sounds.  Pulmonary:  Effort: Pulmonary effort is normal.     Breath sounds: Normal breath sounds.  Abdominal:     General: Bowel sounds are normal.     Palpations: Abdomen is soft.     Tenderness: There is no abdominal tenderness.  Musculoskeletal:     Right lower leg: Edema present.     Left lower leg: Edema present.  Skin:    General: Skin is warm and dry.  Neurological:     Mental Status: She is alert and oriented to person, place, and time.  Psychiatric:        Mood and Affect: Mood normal.        Behavior: Behavior normal.        Thought Content: Thought content normal.        Judgment: Judgment normal.     Imaging: IR THORACENTESIS ASP PLEURAL SPACE W/IMG GUIDE  Result Date: 06/14/2022 INDICATION: Patient with history of lung cancer with recurrent right-sided pleural effusion. Request is for therapeutic right-sided thoracentesis EXAM: ULTRASOUND GUIDED RIGHT-SIDED THERAPEUTIC THORACENTESIS MEDICATIONS: Lidocaine 1% 10 mL COMPLICATIONS: Small post procedural ex-vacuo pnemothorax. Case discussed with IR Attending Dr. Henreitta Leber. Patient states her respiratory status has improved s/p thoracentesis. Patient has elected to go home. Should her respiratory  status change the patient should call 911 and go to the nearest ED. Patient verbalized understanding and is agreement with the plan of care. PROCEDURE: An ultrasound guided thoracentesis was thoroughly discussed with the patient and questions answered. The benefits, risks, alternatives and complications were also discussed. The patient understands and wishes to proceed with the procedure. Written consent was obtained. Ultrasound was performed to localize and mark an adequate pocket of fluid in the right chest. The area was then prepped and draped in the normal sterile fashion. 1% Lidocaine was used for local anesthesia. Under ultrasound guidance a 6 Fr Safe-T-Centesis catheter was introduced. Thoracentesis was performed. The catheter was removed and a dressing applied. The patient was unable to tolerate additional fluid removal at this time FINDINGS: A total of approximately 1.5 L of amber colored fluid was removed. IMPRESSION: Successful ultrasound guided therapeutic right-sided thoracentesis yielding 1.5 L of pleural fluid. Read by: Rushie Nyhan, NP Electronically Signed   By: Lucrezia Europe M.D.   On: 06/14/2022 15:40   DG Chest 1 View  Result Date: 06/14/2022 CLINICAL DATA:  Status post right thoracentesis EXAM: CHEST  1 VIEW COMPARISON:  06/10/2022 FINDINGS: Biapical pleuroparenchymal scarring. Reduced size of the right pleural effusion. Gas density below aerated lung in the right mid chest, probably representing a small loculated pneumothorax given the lack of visible lung markings, but also lack of a clearly defined air-fluid level. There is still pleural fluid on the right. Centrilobular emphysema.  Stable scarring in the left mid lung IMPRESSION: 1. Reduced size of the right pleural effusion, with a small to moderate amount of residual pleural fluid. 2. Suspected small loculated pneumothorax in the right mid chest. This is likely around 5-10% of right hemithoracic volume. Short-term surveillance  radiography may be helpful to ensure lack of enlargement. 3. Centrilobular emphysema. 4. Biapical pleuroparenchymal scarring. Stable scarring in the left mid lung. Critical Value/emergent results were called by telephone at the time of interpretation on 06/14/2022 at 2:13 pm to provider [facility] , who verbally acknowledged these results. Electronically Signed   By: Van Clines M.D.   On: 06/14/2022 14:14   IR THORACENTESIS ASP PLEURAL SPACE W/IMG GUIDE  Result Date: 06/10/2022 INDICATION: Lung cancer, recurrent pleural  effusion EXAM: ULTRASOUND GUIDED RIGHT THORACENTESIS MEDICATIONS: None. COMPLICATIONS: None immediate. PROCEDURE: An ultrasound guided thoracentesis was thoroughly discussed with the patient and questions answered. The benefits, risks, alternatives and complications were also discussed. The patient understands and wishes to proceed with the procedure. Written consent was obtained. Ultrasound was performed to localize and mark an adequate pocket of fluid in the right chest. The area was then prepped and draped in the normal sterile fashion. 1% Lidocaine was used for local anesthesia. Under ultrasound guidance a 6 Fr Safe-T-Centesis catheter was introduced. Thoracentesis was performed. The catheter was removed and a dressing applied. FINDINGS: A total of approximately 1600 ml of clear yellow pleural fluid was removed. IMPRESSION: Successful ultrasound guided right thoracentesis yielding of pleural fluid. Read and performed by: Alexandria Lodge, PA-C Electronically Signed   By: Jacqulynn Cadet M.D.   On: 06/10/2022 18:15   DG Chest 1 View  Result Date: 06/10/2022 CLINICAL DATA:  Status post right thoracentesis. EXAM: CHEST  1 VIEW COMPARISON:  May 31, 2022. FINDINGS: No pneumothorax status post right thoracentesis. Continued presence of moderate right pleural effusion. IMPRESSION: No definite pneumothorax status post right thoracentesis. Electronically Signed   By: Marijo Conception M.D.   On: 06/10/2022 15:10   IR THORACENTESIS ASP PLEURAL SPACE W/IMG GUIDE  Result Date: 05/31/2022 INDICATION: History of lung cancer and recurrent right pleural effusion. Request for therapeutic right thoracentesis. EXAM: ULTRASOUND GUIDED RIGHT THORACENTESIS MEDICATIONS: 1% plain lidocaine, 5 mL COMPLICATIONS: None immediate. PROCEDURE: An ultrasound guided thoracentesis was thoroughly discussed with the patient and questions answered. The benefits, risks, alternatives and complications were also discussed. The patient understands and wishes to proceed with the procedure. Written consent was obtained. Ultrasound was performed to localize and mark an adequate pocket of fluid in the right chest. The area was then prepped and draped in the normal sterile fashion. 1% Lidocaine was used for local anesthesia. Under ultrasound guidance a 6 Fr Safe-T-Centesis catheter was introduced. Thoracentesis was performed. The catheter was removed and a dressing applied. FINDINGS: A total of approximately 1.6 L of clear yellow fluid was removed. IMPRESSION: Successful ultrasound guided right thoracentesis yielding 1.6 of pleural fluid. Read by: Ascencion Dike PA-C Electronically Signed   By: Jerilynn Mages.  Shick M.D.   On: 05/31/2022 11:58   DG Chest 1 View  Result Date: 05/31/2022 CLINICAL DATA:  S/P thoracentesis  1600cc right side EXAM: CHEST  1 VIEW COMPARISON:  05/16/2022 FINDINGS: No pneumothorax. Persistent moderate right pleural effusion. Small left pleural effusion. Marginally improved atelectasis/consolidation of the right lung base. Diffuse interstitial opacities throughout both lungs relatively stable. Biapical pleuroparenchymal scarring with coarse calcifications on the right. Heart size and mediastinal contours are within normal limits. Aortic Atherosclerosis (ICD10-170.0). Visualized bones unremarkable. IMPRESSION: 1. No pneumothorax post right thoracentesis. 2. Persistent moderate right and small left pleural  effusions. Electronically Signed   By: Lucrezia Europe M.D.   On: 05/31/2022 09:46    Labs:  CBC: Recent Labs    04/22/22 2100 04/23/22 0610 05/27/22 1319 06/20/22 0735  WBC 8.4 9.1 10.5 11.5*  HGB 12.5 12.1 13.9 12.9  HCT 40.2 38.6 43.4 42.2  PLT 206 198 384 480*    COAGS: Recent Labs    04/22/22 1703  INR 1.2    BMP: Recent Labs    03/18/22 1031 04/22/22 1357 04/22/22 2100 04/23/22 0610 05/27/22 1319  NA 142 140  --  137 139  K 4.4 4.6  --  4.4 4.9  CL 104  101  --  104 100  CO2 34* 32  --  25 36*  GLUCOSE 96 116*  --  83 114*  BUN 23 26*  --  19 32*  CALCIUM 9.5 9.5  --  8.0* 9.8  CREATININE 0.92 1.15* 1.08* 0.92 1.38*  GFRNONAA >60 50* 54* >60 40*    LIVER FUNCTION TESTS: Recent Labs    02/22/22 1346 03/18/22 1031 04/22/22 1357 05/27/22 1319  BILITOT 0.2* 0.3 0.4 0.3  AST 24 20 24 28   ALT 15 12 11 13   ALKPHOS 70 61 66 93  PROT 6.7 6.8 7.3 6.6  ALBUMIN 3.6 3.8 3.7 3.6    TUMOR MARKERS: No results for input(s): "AFPTM", "CEA", "CA199", "CHROMGRNA" in the last 8760 hours.  Assessment and Plan:  Robin Bryant is a 72 yo female with PMH significant for lung cancer and recurrent right malignant pleural effusions being seen today for image-guided PleurX catheter placement. The patient presents today in her usual state of health and is looking forward to having the PleurX placed.   Risks and benefits of PleurX catheter placement were discussed with the patient including bleeding, infection, damage to adjacent structures, pneumothorax potentially requiring further intervention, malfunction of the tube requiring additional procedures, and sepsis.  All of the patient's questions were answered, patient is agreeable to proceed. Consent signed and in chart.   Thank you for this interesting consult.  I greatly enjoyed meeting Robin Bryant and look forward to participating in their care.  A copy of this report was sent to the requesting provider on this  date.  Electronically Signed: Lura Em, PA-C 06/20/2022, 7:59 AM   I spent a total of  25 Minutes in face to face in clinical consultation, greater than 50% of which was counseling/coordinating care for recurrent pleural effusion.

## 2022-06-21 ENCOUNTER — Inpatient Hospital Stay: Payer: Medicare Other

## 2022-06-24 ENCOUNTER — Telehealth: Payer: Self-pay

## 2022-06-24 NOTE — Telephone Encounter (Signed)
Pt friend called on pt behalf stating they had not heard from hospice, RN collected correct numbers and forwarded them to [place] with [employer], no further needs at this time.

## 2022-07-08 ENCOUNTER — Ambulatory Visit: Payer: Medicare Other | Admitting: Hematology and Oncology

## 2022-07-08 ENCOUNTER — Other Ambulatory Visit: Payer: Medicare Other

## 2022-11-18 DEATH — deceased

## 2023-09-23 IMAGING — CT CT CHEST W/ CM
1 series · 15 of 34 positions shown, 19 images · IV contrast (APPLIED)
Comparison: MR thoracic spine 02/14/2021.

CLINICAL DATA: Right lung lesion on MRI thoracic spine 02/14/2021.

EXAM:
CT CHEST WITH CONTRAST
TECHNIQUE: Multidetector CT imaging of the chest was performed during
intravenous contrast administration.
CONTRAST:  80mL X2XT0O-21X IOPAMIDOL (X2XT0O-21X) INJECTION 76%

[Series 2: chest w/cm · axial · 0.62mm/px · z∈[-278,+10]mm · 15 of 170 slices shown, 19 images]
[im 13/170  mediastinal]
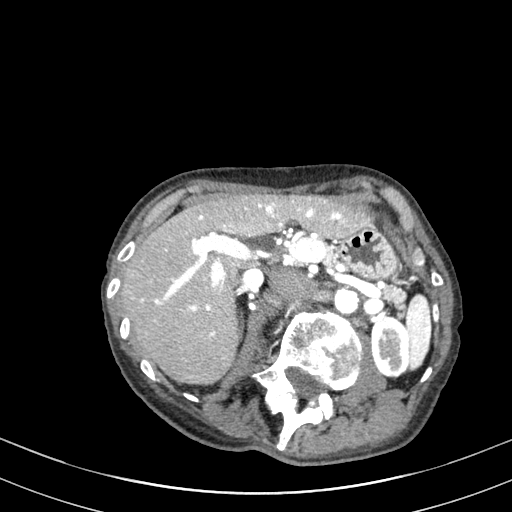
[im 13/170  lung]
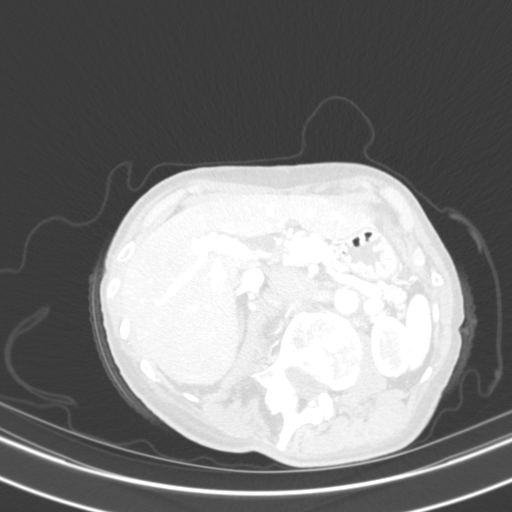
[im 26/170  lung]
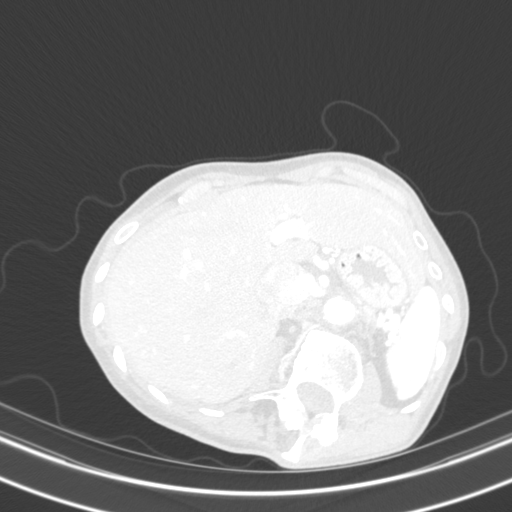
[im 34/170  lung]
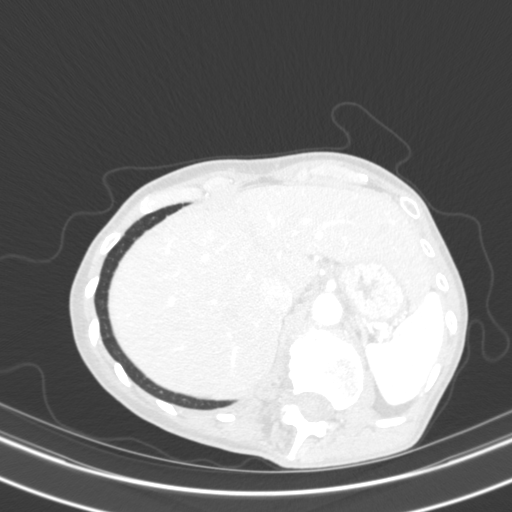
[im 44/170  lung]
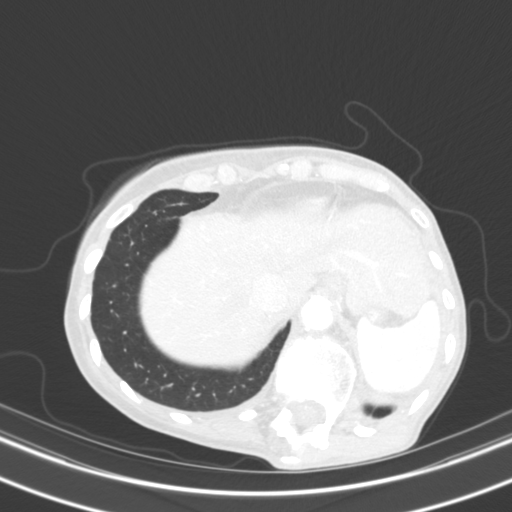
[im 57/170  mediastinal]
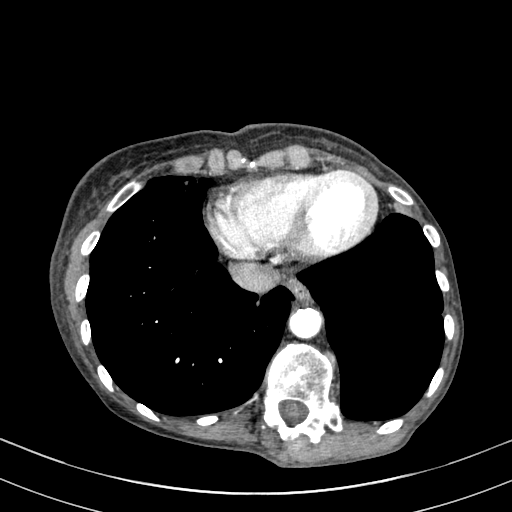
[im 57/170  lung]
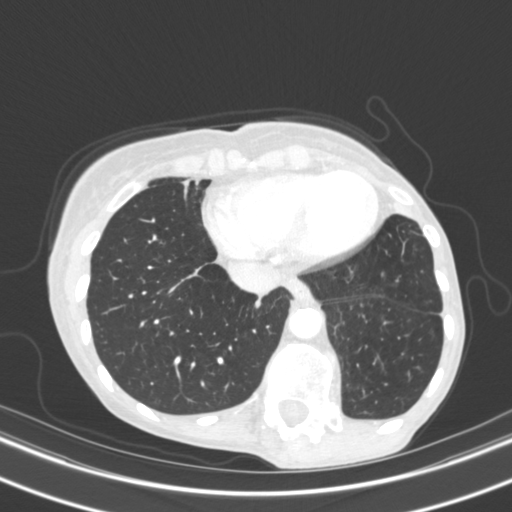
[im 68/170  lung]
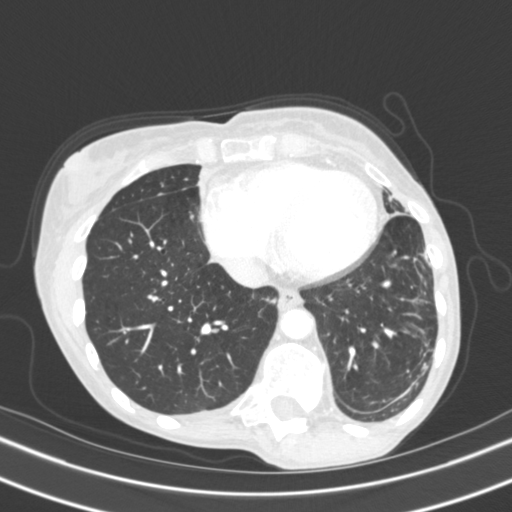
[im 76/170  lung]
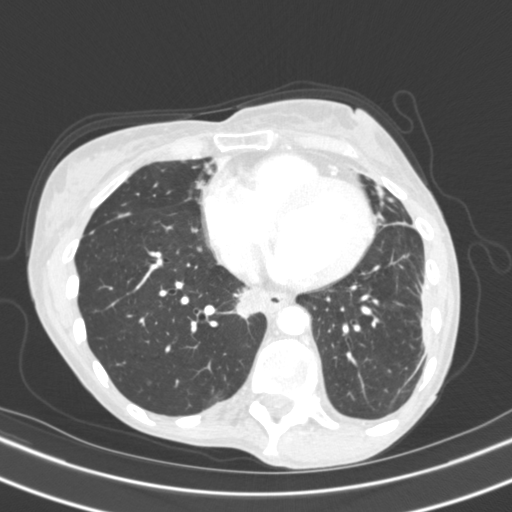
[im 88/170  lung]
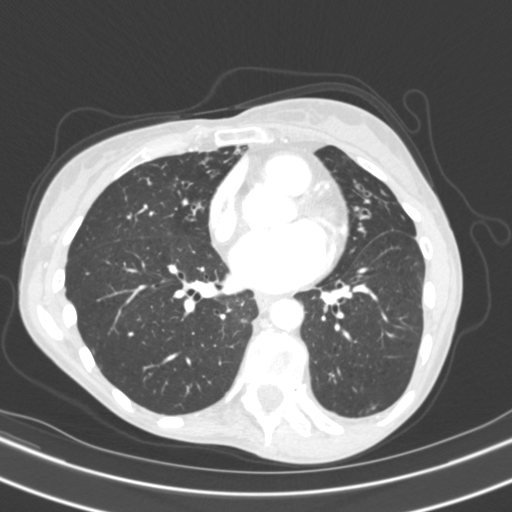
[im 94/170  mediastinal]
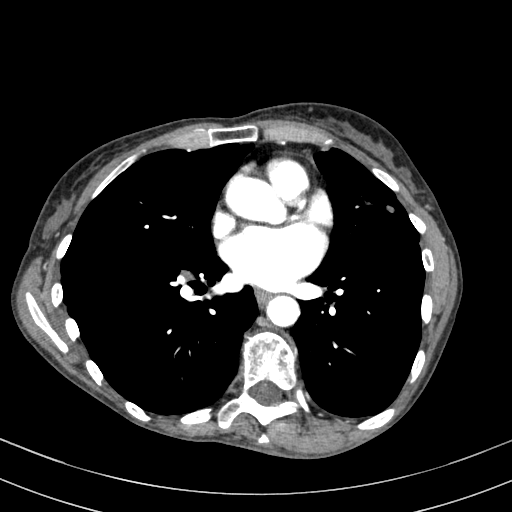
[im 94/170  lung]
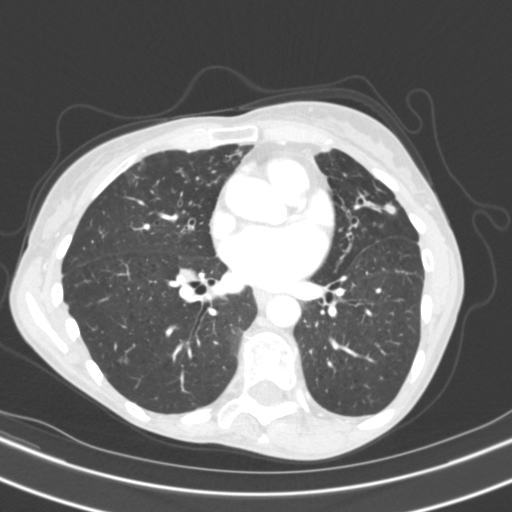
[im 102/170  lung]
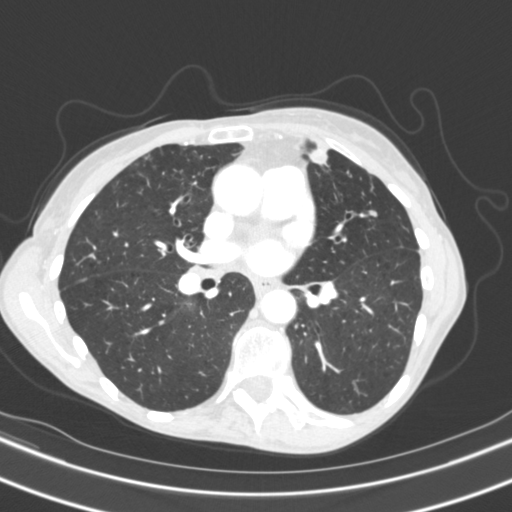
[im 113/170  lung]
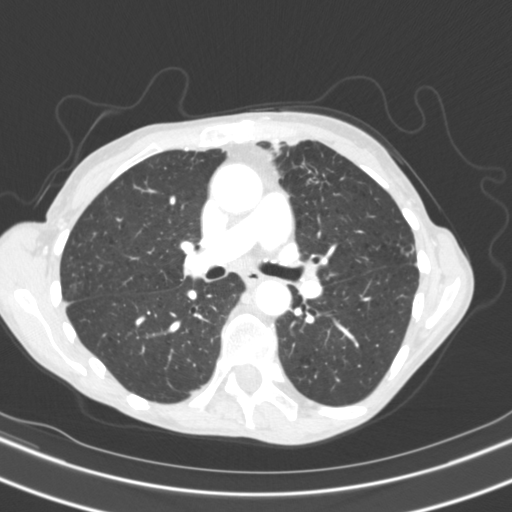
[im 126/170  lung]
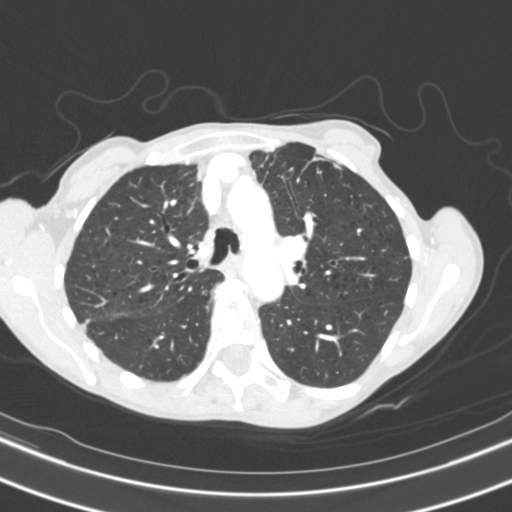
[im 136/170  mediastinal]
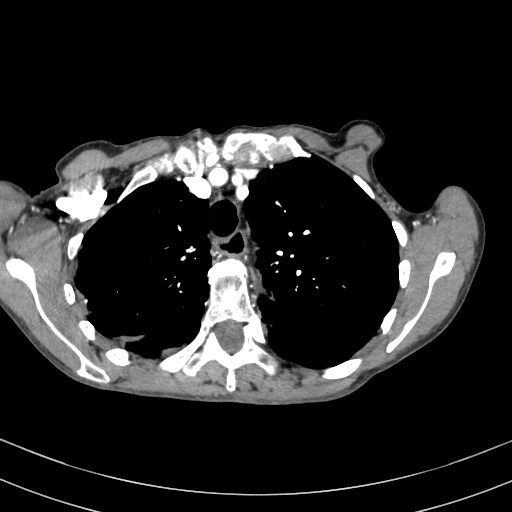
[im 136/170  lung]
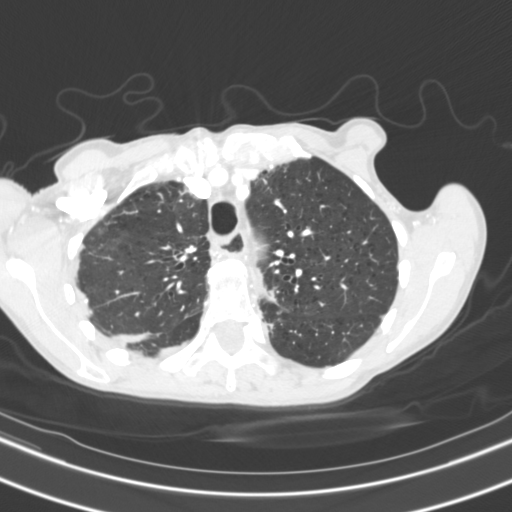
[im 144/170  lung]
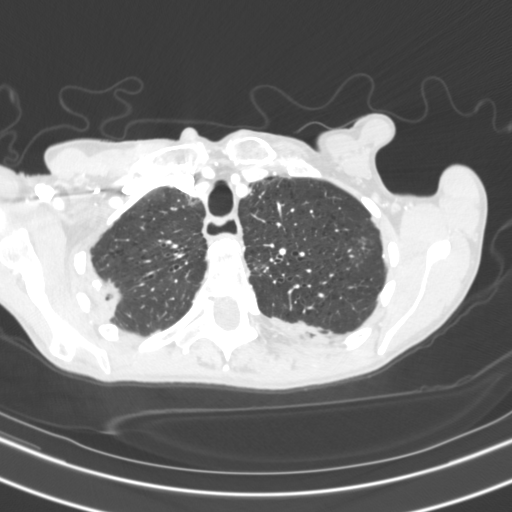
[im 157/170  lung]
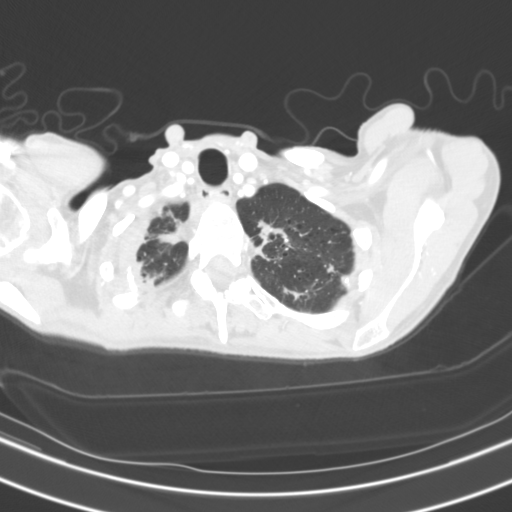

[15 of 34 positions shown; findings below may reference images not displayed]

FINDINGS: Cardiovascular: Atherosclerotic calcification of the aorta and
coronary arteries. Heart is at the upper limits of normal in size.
No pericardial effusion.

Mediastinum/Nodes: Heterogeneous left thyroid nodule measures
cm. No pathologically enlarged mediastinal, hilar or axillary lymph
nodes. Esophagus is unremarkable.

Lungs/Pleura: Centrilobular emphysema. Biapical pleuroparenchymal
scarring with nodular components. Additional bilateral pulmonary
nodules measure up to 1.8 x 2.3 cm in the medial right lower lobe
(3/92). Mild bronchiectasis, mucoid impaction and
peribronchovascular nodularity primarily in the right middle lobe
and lingula. Subpleural scarring in the peripheral left lower lobe.
Trace right pleural fluid. Airway is unremarkable.

Upper Abdomen: Visualized portions of the liver, adrenal glands,
kidneys, spleen, pancreas, stomach and bowel are grossly
unremarkable. No upper abdominal adenopathy.

Musculoskeletal: Degenerative changes in the spine. Rotatory
scoliosis. No worrisome lytic or sclerotic lesions. Old left rib
fracture.
IMPRESSION: 1. Mild bronchiectasis, mucoid impaction and peribronchovascular
nodularity are seen predominantly in the right middle lobe and
lingula, findings indicative mycobacterium avium complex.
2. Bilateral pulmonary nodules may be infectious or inflammatory in
etiology as well. Difficult to exclude malignancy. Consider
short-term follow-up CT chest without contrast in 4-6 weeks, as
clinically indicated.
3. Trace right pleural fluid.
4. Heterogeneous 1.6 cm left thyroid nodule. Recommend thyroid
ultrasound. (Ref: [HOSPITAL]. [DATE]): 143-50).
5. Aortic atherosclerosis (VYKB[facility]-U31.1). Coronary artery
calcification.
6.  Emphysema ([id number].O).

## 2023-11-29 IMAGING — CT CT CHEST SUPER D W/O CM
2 of 4 series · 15 of 36 positions shown, 18 images · non-contrast
Comparison: PET-CT dated 04/16/21.  CT chest dated 02/23/2021.

CLINICAL DATA: Multiple pulmonary nodules

EXAM:
CT CHEST WITHOUT CONTRAST
TECHNIQUE: Multidetector CT imaging of the chest was performed using thin slice
collimation for electromagnetic bronchoscopy planning purposes,
without intravenous contrast.

[Series 6: lungs · axial · 0.56mm/px · z∈[+1245,+1565]mm · 12 of 180 slices shown, 15 images]
[im 10/180  mediastinal]
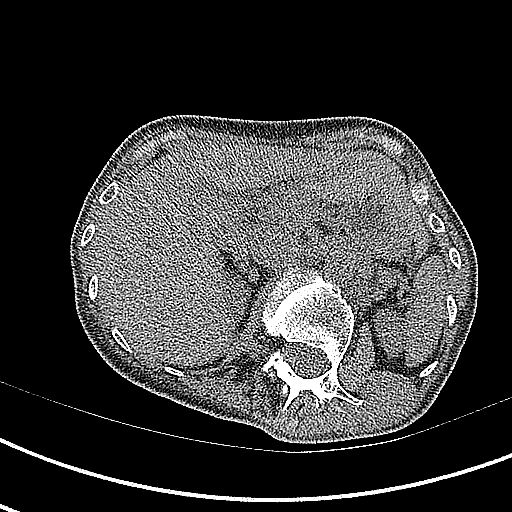
[im 10/180  lung]
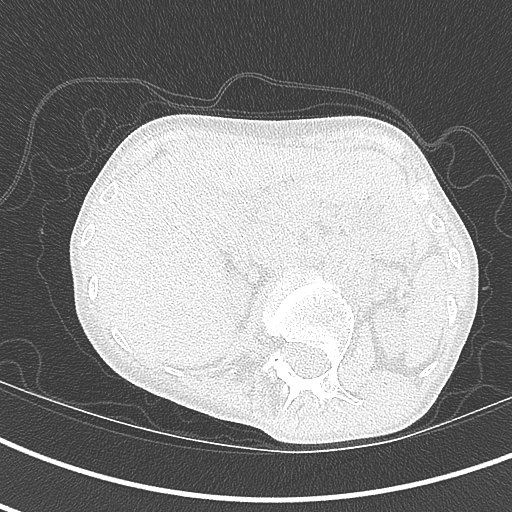
[im 29/180  lung]
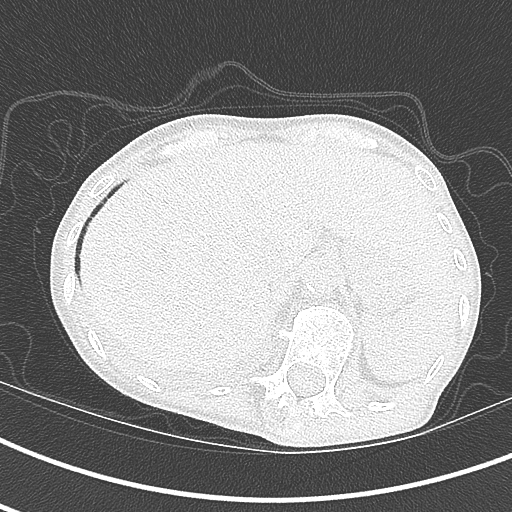
[im 38/180  lung]
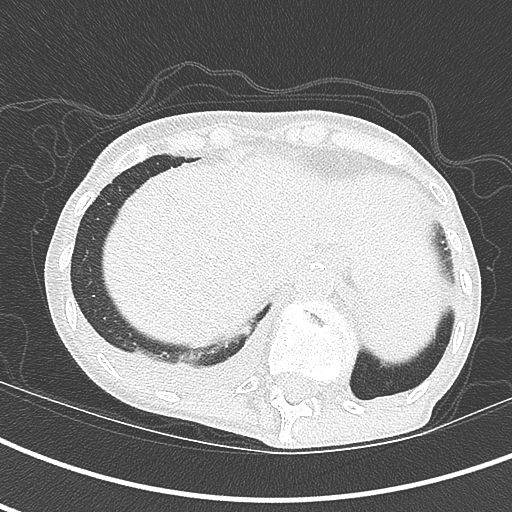
[im 57/180  lung]
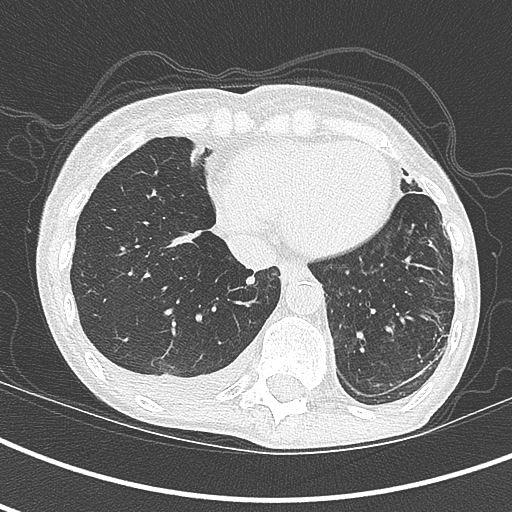
[im 66/180  mediastinal]
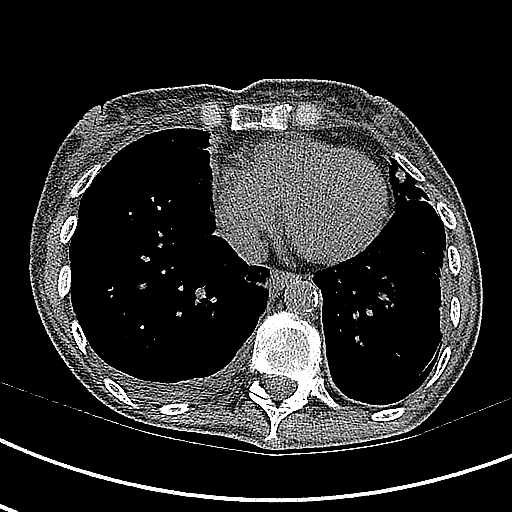
[im 66/180  lung]
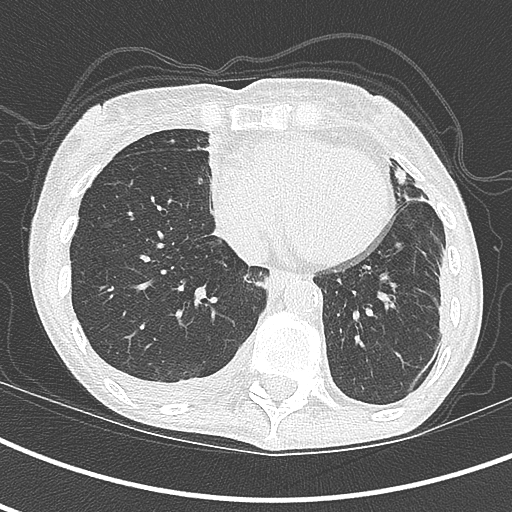
[im 85/180  lung]
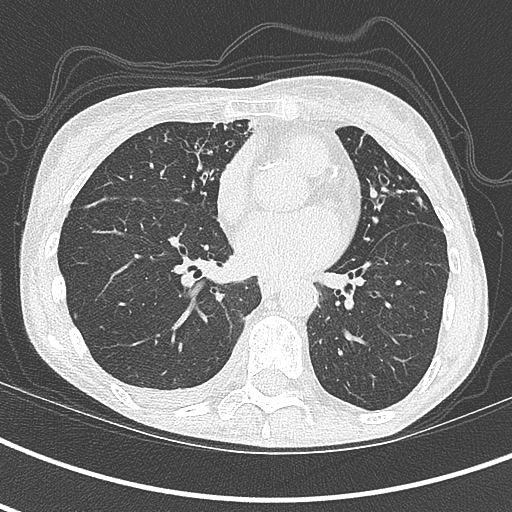
[im 95/180  lung]
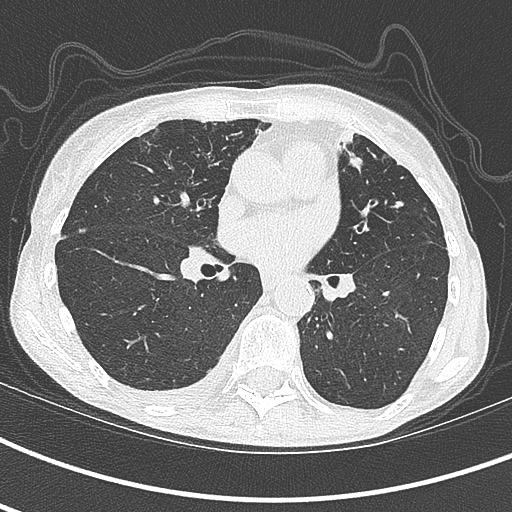
[im 114/180  lung]
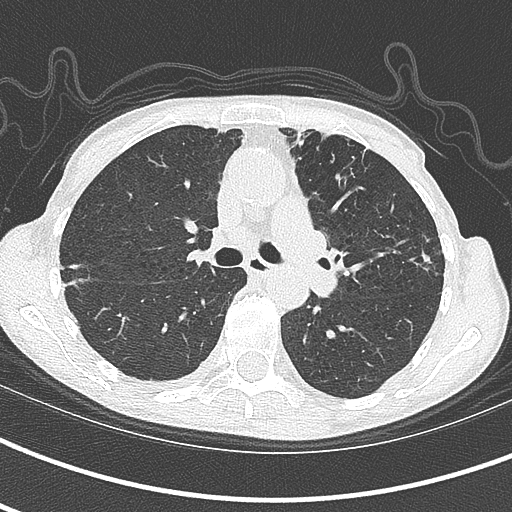
[im 123/180  mediastinal]
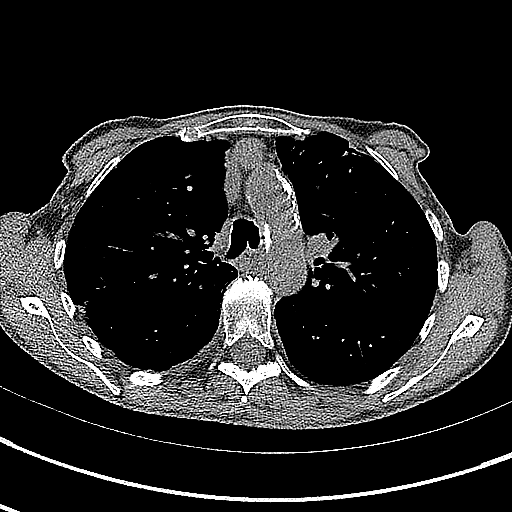
[im 123/180  lung]
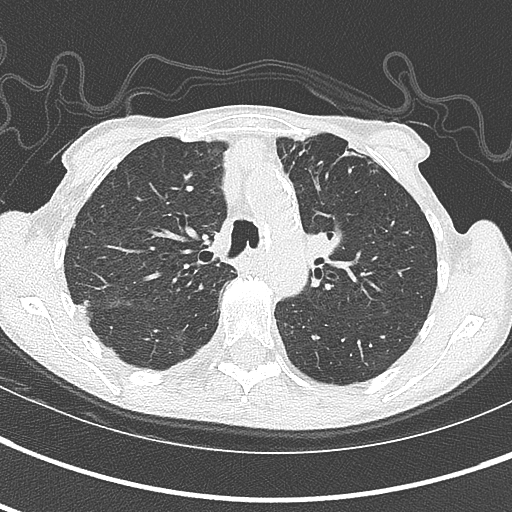
[im 142/180  lung]
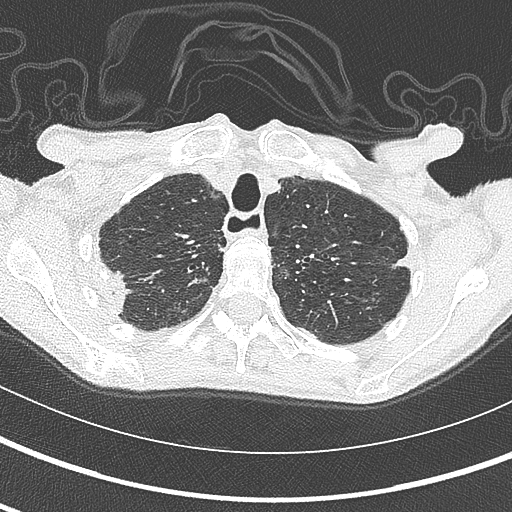
[im 151/180  lung]
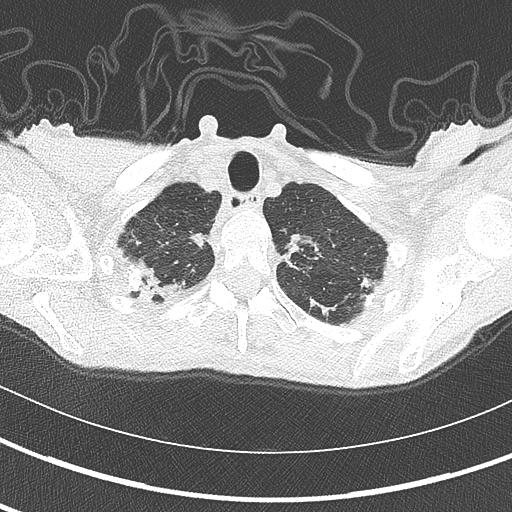
[im 170/180  lung]
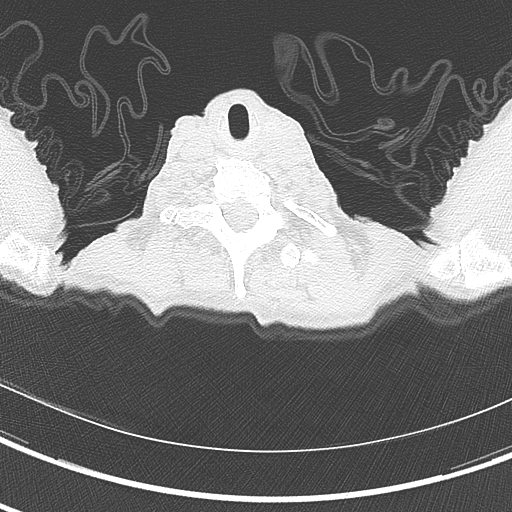

[Series 7: coronal · coronal · 0.54mm/px · 3 of 108 slices shown]
[im 22/108  lung]
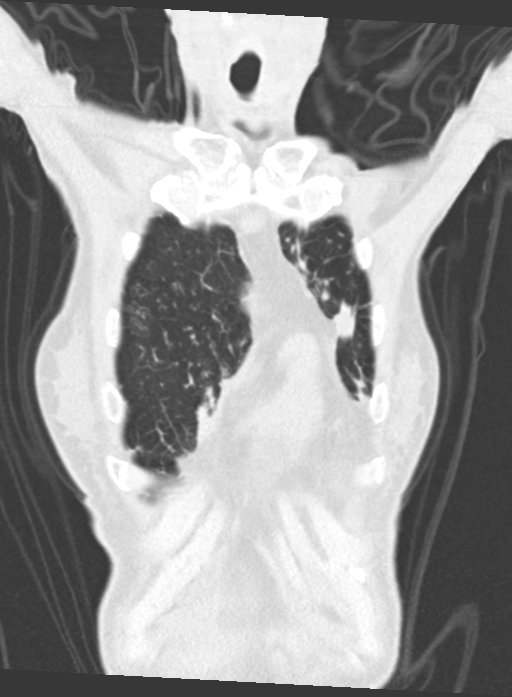
[im 43/108  lung]
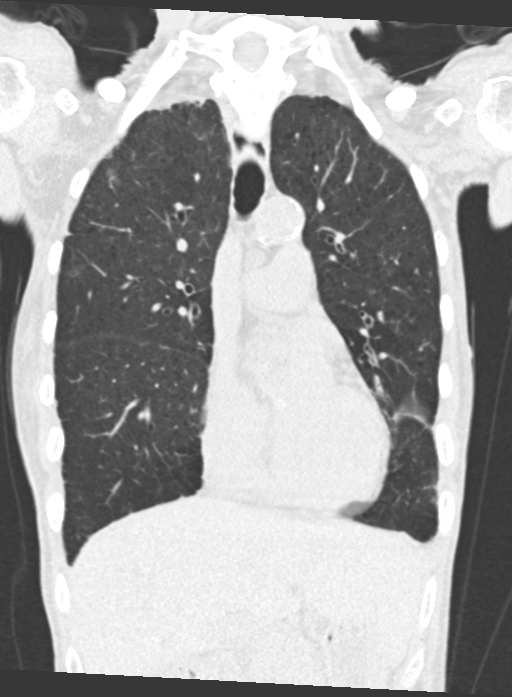
[im 65/108  lung]
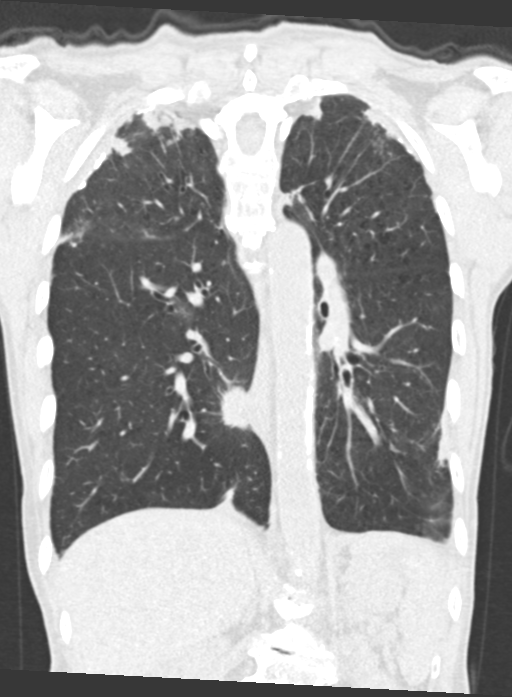

[15 of 36 positions shown; findings below may reference images not displayed]

FINDINGS: Cardiovascular: Heart is normal in size.  No pericardial effusion.

No evidence of thoracic aortic aneurysm. Atherosclerotic
calcifications of the aortic arch.

Coronary atherosclerosis of the LAD.

Mediastinum/Nodes: No suspicious mediastinal lymphadenopathy.

Stable 16 mm left thyroid nodule (series 3/image 7).

Lungs/Pleura: Biapical pleural-parenchymal scarring.

Mild centrilobular and paraseptal emphysematous changes.

Scattered areas of bronchiectasis with upper lung volume loss and
peribronchovascular nodularity, suggesting sequela of chronic
atypical mycobacterial infection.

Dominant 1.7 x 2.3 cm irregular nodule in the medial right lower
lobe (series 6/image 108), unchanged. Additional dominant 13 x 10 mm
irregular nodule in the anterior left upper lobe (series 6/image
39), previously 12 x 10 mm, grossly unchanged.

Small right pleural effusion, new from prior CT and progressive from
prior PET. No pneumothorax.

Upper Abdomen: Visualized upper abdomen is grossly normal, noting
vascular calcifications.

Musculoskeletal: Mild degenerative changes of the visualized
thoracolumbar spine.
IMPRESSION: Dominant bilateral pulmonary nodules, as above, unchanged.

Additional sequela of chronic atypical mycobacterial infection.
Small right pleural effusion, mildly progressive.

Aortic Atherosclerosis (YGNQW-TPM.M) and Emphysema ([id number].E).

## 2023-12-03 IMAGING — DX DG CHEST 1V PORT
1 series · 1 of 1 positions shown · non-contrast
Comparison: Multiple exams, including chest CT 05/11/2021

CLINICAL DATA: Bilateral pulmonary nodules, status post
bronchoscopy

EXAM:
PORTABLE CHEST 1 VIEW

[chest ap]
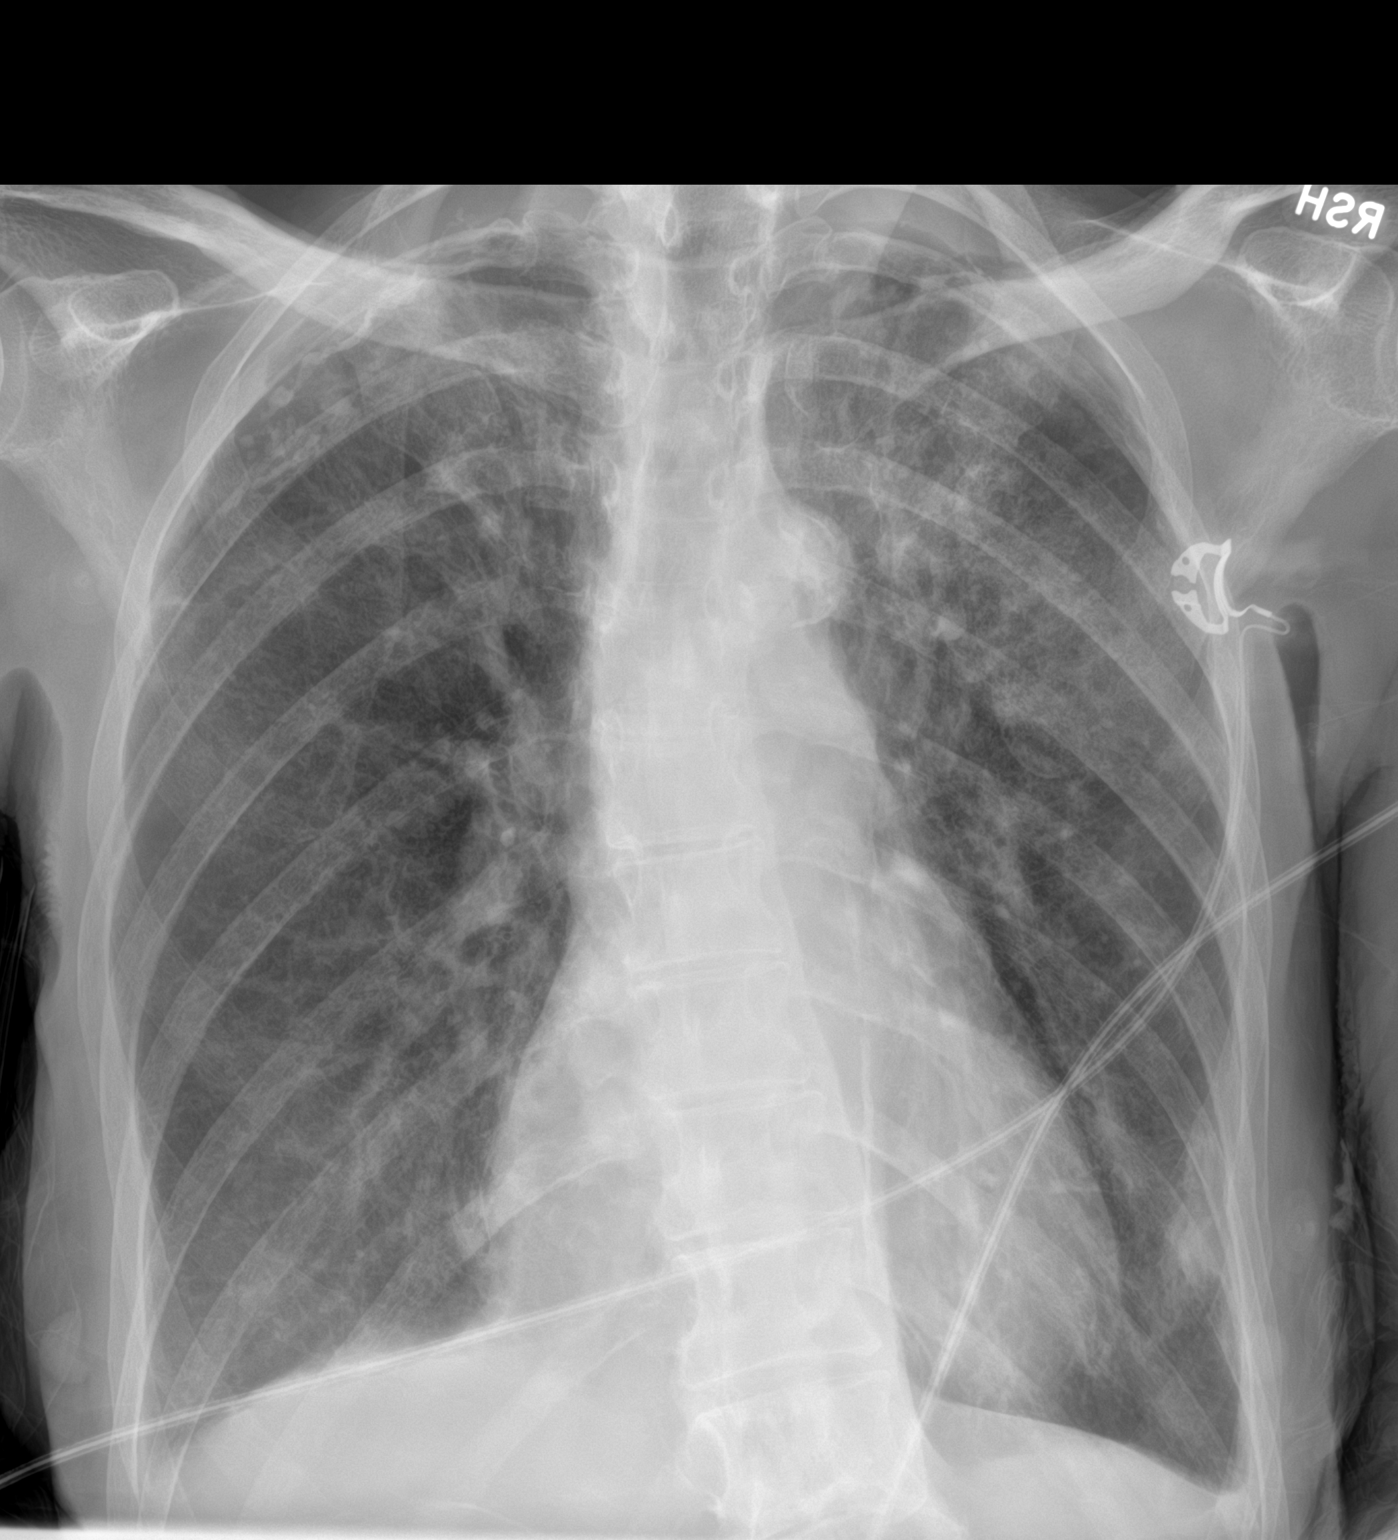

[1 of 1 positions shown; findings below may reference images not displayed]

FINDINGS: Bilateral calcified pleural plaques. Biapical pleuroparenchymal
scarring. Mildly increased hazy airspace opacity in the left upper
lobe, potentially from a small amount of localized hemorrhage.
Stable pleural-based scarring/density laterally at the left lung
base. Stable right retrocardiac nodularity. Atherosclerotic
calcification of the aortic arch. Stable mild blunting of the left
lateral costophrenic angle.
IMPRESSION: 1. Hazy density in the left upper lobe, nonspecific but possibly
reflecting post biopsy blood products or atelectasis.
2. No pneumothorax observed.
3. Stable nodularity at the lung bases and stable biapical
pleuroparenchymal scarring with associated calcifications at the
apices.
4.  Aortic Atherosclerosis ([id number]).
